# Patient Record
Sex: Female | Born: 1970
Health system: Southern US, Community
[De-identification: ages and names within clinical notes are randomized; demographics above are authoritative.]

## PROBLEM LIST (undated history)

## (undated) DIAGNOSIS — K589 Irritable bowel syndrome without diarrhea: Secondary | ICD-10-CM

## (undated) DIAGNOSIS — R87610 Atypical squamous cells of undetermined significance on cytologic smear of cervix (ASC-US): Secondary | ICD-10-CM

## (undated) DIAGNOSIS — K219 Gastro-esophageal reflux disease without esophagitis: Secondary | ICD-10-CM

## (undated) DIAGNOSIS — R87810 Cervical high risk human papillomavirus (HPV) DNA test positive: Secondary | ICD-10-CM

## (undated) DIAGNOSIS — J45909 Unspecified asthma, uncomplicated: Secondary | ICD-10-CM

## (undated) HISTORY — DX: Atypical squamous cells of undetermined significance on cytologic smear of cervix (ASC-US): R87.610

## (undated) HISTORY — DX: Irritable bowel syndrome, unspecified: K58.9

## (undated) HISTORY — PX: OTHER SURGICAL HISTORY: SHX169

## (undated) HISTORY — PX: THERAPEUTIC ABORTION: SHX798

## (undated) HISTORY — DX: Gastro-esophageal reflux disease without esophagitis: K21.9

## (undated) HISTORY — DX: Cervical high risk human papillomavirus (HPV) DNA test positive: R87.810

## (undated) HISTORY — DX: Unspecified asthma, uncomplicated: J45.909

---

## 1991-03-24 HISTORY — PX: APPENDECTOMY: SHX54

## 2008-03-23 HISTORY — PX: OTHER SURGICAL HISTORY: SHX169

## 2010-07-15 ENCOUNTER — Ambulatory Visit
Admission: RE | Admit: 2010-07-15 | Discharge: 2010-07-15 | Disposition: A | Discharge status: Home / Self Care | Payer: BLUE CROSS/BLUE SHIELD | Source: Ambulatory Visit | Attending: Obstetrics and Gynecology | Admitting: Obstetrics and Gynecology

## 2010-07-15 ENCOUNTER — Other Ambulatory Visit: Payer: Self-pay | Admitting: Obstetrics and Gynecology

## 2010-07-15 MED ORDER — IOHEXOL 300 MG/ML  SOLN
125.0000 mL | Freq: Once | INTRAMUSCULAR | Status: AC | PRN
  Administered 2010-07-15: 125 mL via INTRAVENOUS

## 2010-08-20 ENCOUNTER — Ambulatory Visit: Payer: Self-pay | Admitting: Gastroenterology

## 2010-10-02 LAB — HM MAMMOGRAPHY: HM Mammogram: NORMAL

## 2011-01-02 ENCOUNTER — Encounter: Payer: Self-pay | Admitting: *Deleted

## 2011-01-02 ENCOUNTER — Encounter: Payer: Self-pay | Admitting: Gynecology

## 2011-01-02 ENCOUNTER — Ambulatory Visit (INDEPENDENT_AMBULATORY_CARE_PROVIDER_SITE_OTHER): Payer: 59 | Admitting: Gynecology

## 2011-01-02 ENCOUNTER — Telehealth: Payer: Self-pay | Admitting: *Deleted

## 2011-01-02 VITALS — BP 130/70 | Ht 65.25 in | Wt 187.0 lb

## 2011-01-02 DIAGNOSIS — L293 Anogenital pruritus, unspecified: Secondary | ICD-10-CM

## 2011-01-02 DIAGNOSIS — N938 Other specified abnormal uterine and vaginal bleeding: Secondary | ICD-10-CM

## 2011-01-02 DIAGNOSIS — B3731 Acute candidiasis of vulva and vagina: Secondary | ICD-10-CM

## 2011-01-02 DIAGNOSIS — K589 Irritable bowel syndrome without diarrhea: Secondary | ICD-10-CM | POA: Insufficient documentation

## 2011-01-02 DIAGNOSIS — N949 Unspecified condition associated with female genital organs and menstrual cycle: Secondary | ICD-10-CM

## 2011-01-02 DIAGNOSIS — M069 Rheumatoid arthritis, unspecified: Secondary | ICD-10-CM | POA: Insufficient documentation

## 2011-01-02 DIAGNOSIS — R82998 Other abnormal findings in urine: Secondary | ICD-10-CM

## 2011-01-02 DIAGNOSIS — R319 Hematuria, unspecified: Secondary | ICD-10-CM

## 2011-01-02 DIAGNOSIS — B009 Herpesviral infection, unspecified: Secondary | ICD-10-CM | POA: Insufficient documentation

## 2011-01-02 DIAGNOSIS — L292 Pruritus vulvae: Secondary | ICD-10-CM

## 2011-01-02 DIAGNOSIS — B373 Candidiasis of vulva and vagina: Secondary | ICD-10-CM

## 2011-01-02 DIAGNOSIS — R823 Hemoglobinuria: Secondary | ICD-10-CM

## 2011-01-02 DIAGNOSIS — N390 Urinary tract infection, site not specified: Secondary | ICD-10-CM

## 2011-01-02 DIAGNOSIS — Z23 Encounter for immunization: Secondary | ICD-10-CM

## 2011-01-02 DIAGNOSIS — N920 Excessive and frequent menstruation with regular cycle: Secondary | ICD-10-CM

## 2011-01-02 MED ORDER — CIPROFLOXACIN HCL 250 MG PO TABS: 250.0000 mg | ORAL_TABLET | Freq: Two times a day (BID) | ORAL | Status: AC

## 2011-01-02 MED ORDER — FLUCONAZOLE 150 MG PO TABS: 150.0000 mg | ORAL_TABLET | Freq: Once | ORAL | Status: AC

## 2011-01-02 NOTE — Telephone Encounter (Signed)
The below note is a error, documentation on wrong pt. Please disregard.

## 2011-01-02 NOTE — Telephone Encounter (Signed)
Pt called wanting prescription for depo-provera shot. Pt last office visit states she is on birth control pills not depo. I told pt she would need to get medication from dr. Randa Evens office, being that she is the doctor that wrote Rx.

## 2011-01-02 NOTE — Progress Notes (Signed)
  Rx for Cipro called in to [facility] [facility].

## 2011-01-02 NOTE — Progress Notes (Signed)
40 year old G2 P0 TAB 2 female presents as a new patient complaining of years history of menorrhagia and now with breakthrough bleeding spotting on a daily basis for months. She was recently treated for UTI notes though that she's having some mild dysuria and vulvar itching also over the past several weeks. Her last annual exam was January 2012 and she had her mammogram this past year also. She is painful for rheumatoid arthritis but currently on no active management. Notes that her menses have been heavy and painful for years initially treated with narcotics but now with ibuprofen.  Exam directed towards complaint Spine: straight no CVA tenderness Abdomen: soft nontender without masses guarding rebound organomegaly Pelvic: External BUS vagina with brown old blood staining KOH wet prep done, cervix normal, uterus anteverted normal size midline mobile nontender, adnexa without masses or tenderness, rectovaginal exam is normal  Assessment and plan: 1. Wet prep positive for yeast. We'll treat with Diflucan 150x1 dose follow up if vulvar pruritus continues. 2. Menorrhagia, dub. Discussed differential to include hormonal dysfunction versus structural such as polyps leiomyoma versus endometriosis to include adenomyosis. We'll start with TSH FSH prolactin CBC sonohysterogram. Various scenarios and options to include attempted conservative such as hormonal manipulation low-dose pills IUD versus surgical options including hysterectomy was discussed. Patient will follow up for lab results and so his grandmother will go from there. 3. Urinary symptoms. Patient has mild dysuria recent treatment for UTI historically. Her urine today is contaminated but does show leukocyte esterase and blood with 2+ bacteria. Will cover with ciprofloxacin 250 twice a day x7 days follow up on culture. Call if symptoms persist.

## 2011-02-20 ENCOUNTER — Telehealth: Payer: Self-pay | Admitting: *Deleted

## 2011-02-20 NOTE — Telephone Encounter (Signed)
Pt called on 02/18/11 wanting refill for her xanax. Lm on pt vm to have pharmacy fax over refill request.

## 2011-03-24 DIAGNOSIS — R87610 Atypical squamous cells of undetermined significance on cytologic smear of cervix (ASC-US): Secondary | ICD-10-CM

## 2011-03-24 HISTORY — DX: Atypical squamous cells of undetermined significance on cytologic smear of cervix (ASC-US): R87.610

## 2011-05-25 IMAGING — CT CT ABD-PEL WO/W CM
2 of 6 series · 17 of 46 positions shown, 19 images · IV contrast (agent unspecified)
Comparison: None.

CLINICAL DATA: Lower abdominal pain.  Right-sided low back pain.
Nausea.  Diarrhea for three - 4 days.  Prior appendectomy in 3777.

CT ABDOMEN AND PELVIS WITHOUT AND WITH CONTRAST
TECHNIQUE: Multidetector CT imaging of the abdomen and pelvis was
performed without contrast material in one or both body regions,
followed by contrast material(s) and further sections in one or
both body regions.
Contrast: 125 ml Qmnipaque-1HH

[Series 4: abdomen w/ · axial · 0.70mm/px · z∈[-415,-45]mm · 14 of 86 slices shown, 16 images]
[im 6/86  soft-tissue]
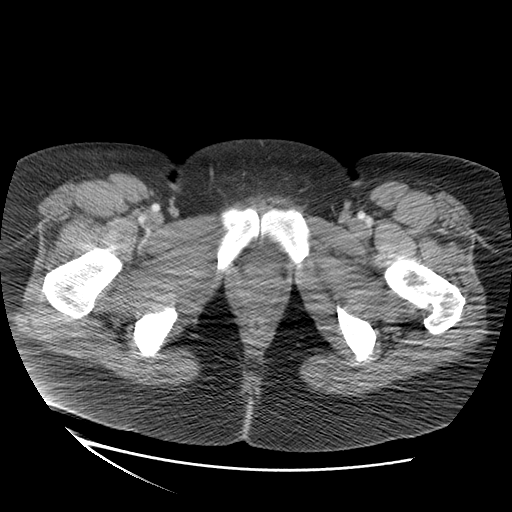
[im 6/86  bone]
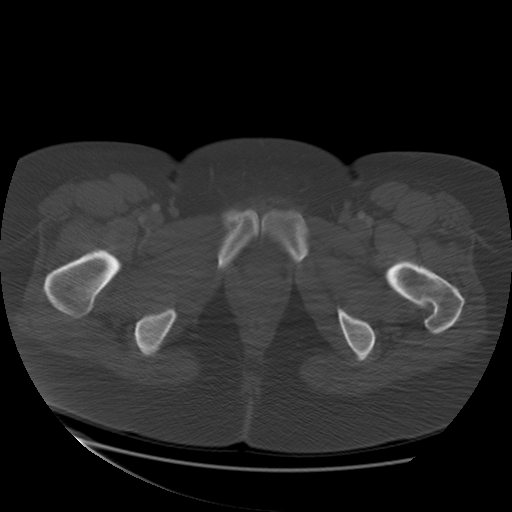
[im 12/86  soft-tissue]
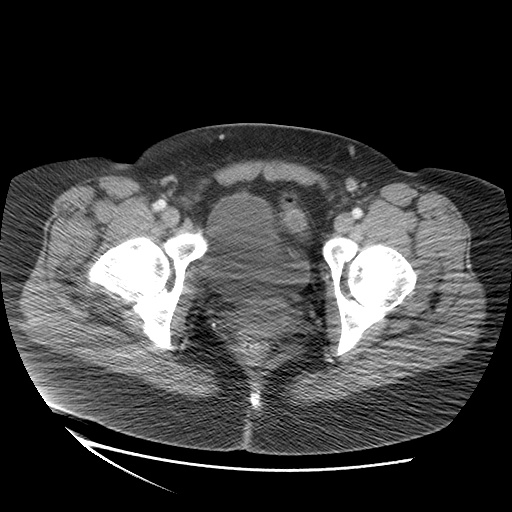
[im 18/86  soft-tissue]
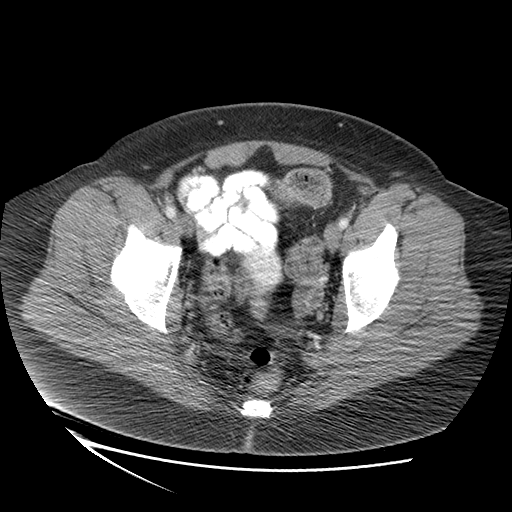
[im 23/86  soft-tissue]
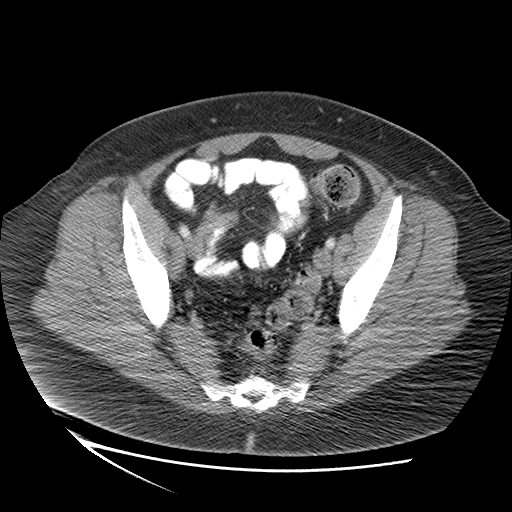
[im 29/86  soft-tissue]
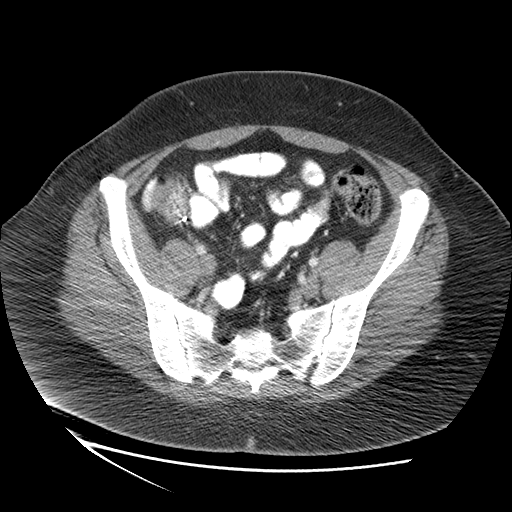
[im 35/86  soft-tissue]
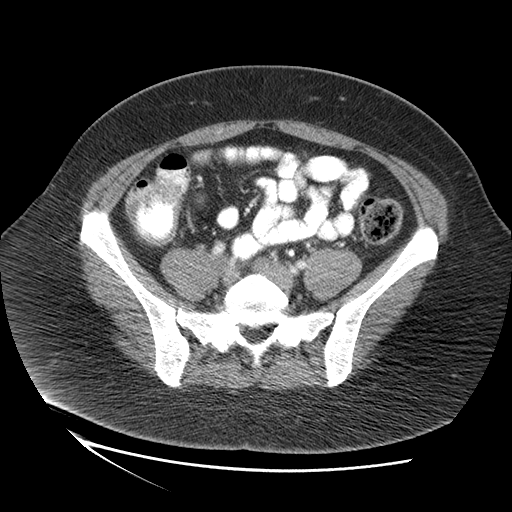
[im 40/86  soft-tissue]
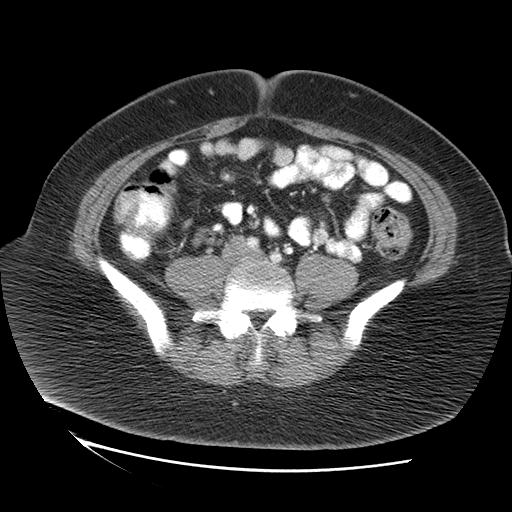
[im 46/86  soft-tissue]
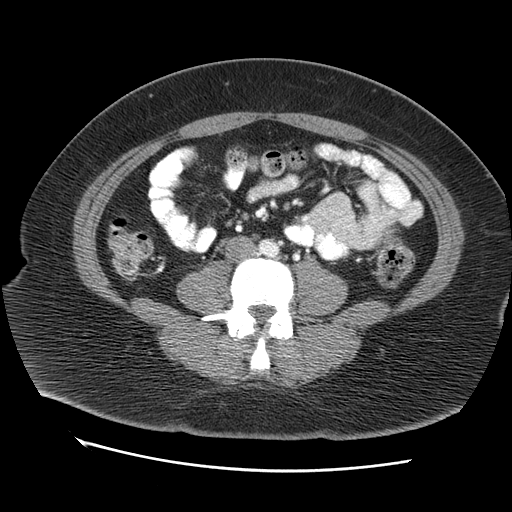
[im 52/86  soft-tissue]
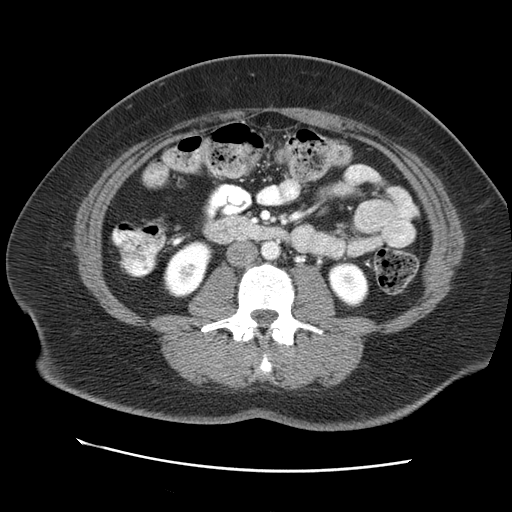
[im 52/86  bone]
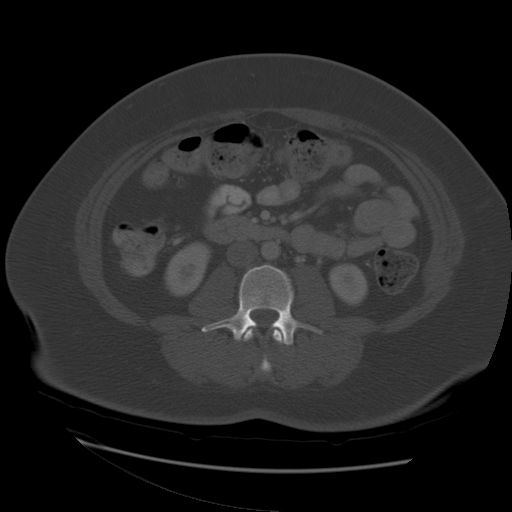
[im 57/86  soft-tissue]
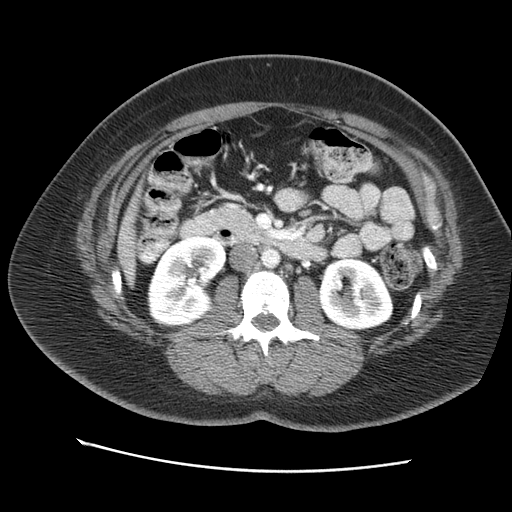
[im 63/86  soft-tissue]
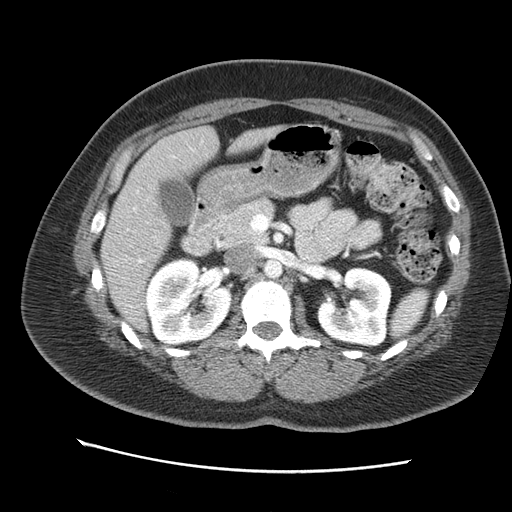
[im 69/86  soft-tissue]
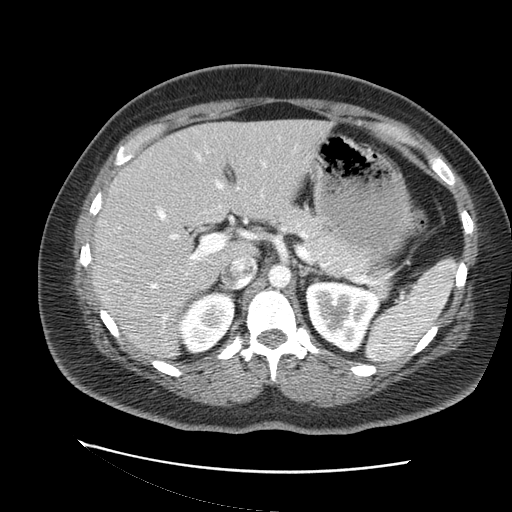
[im 74/86  soft-tissue]
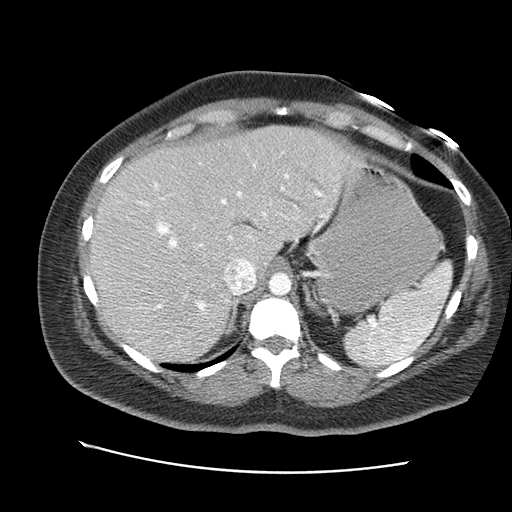
[im 80/86  soft-tissue]
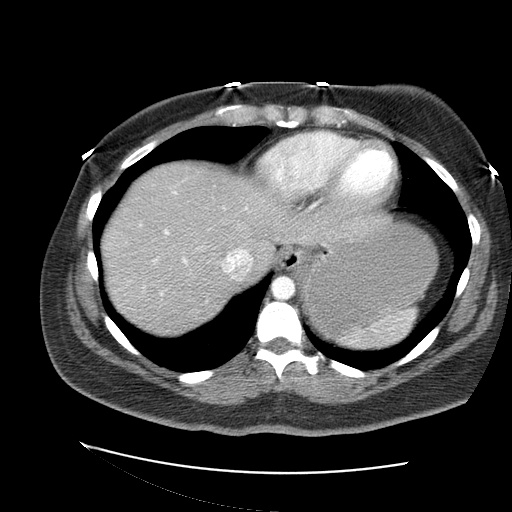

[Series 600: coronals · coronal · 0.88mm/px · 3 of 122 slices shown]
[im 41/122  soft-tissue]
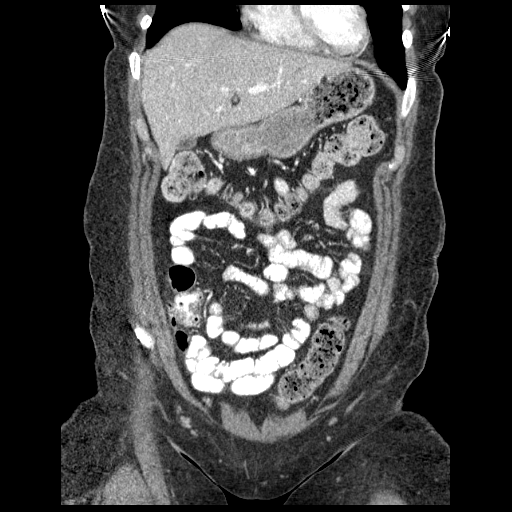
[im 54/122  soft-tissue]
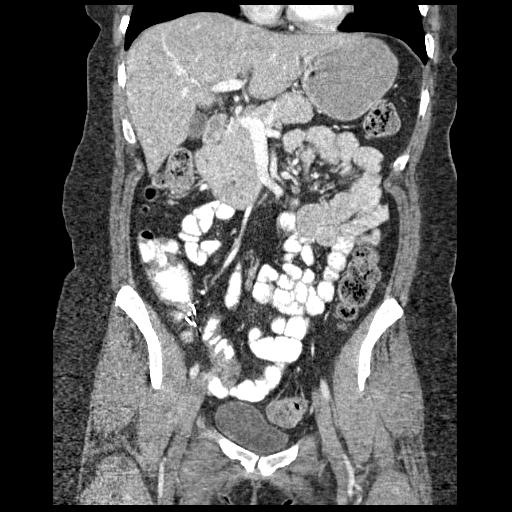
[im 68/122  soft-tissue]
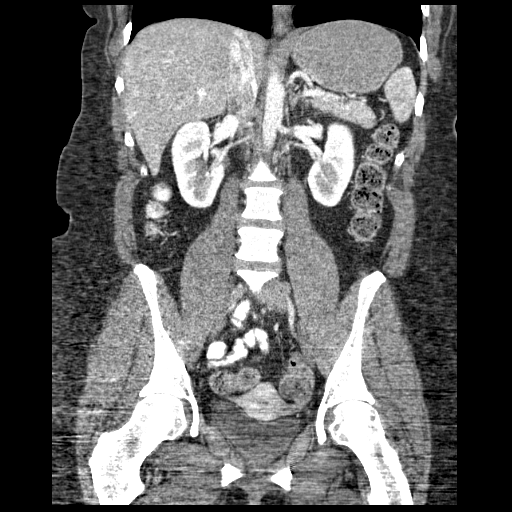

[17 of 46 positions shown; findings below may reference images not displayed]

FINDINGS: Clear lung bases.  Unenhanced images of the abdomen
demonstrate no renal calculi or hydroureter.

Postcontrast images demonstrate normal heart size without
pericardial or pleural effusion.  Mild hepatic steatosis.  No focal
liver lesion.  Normal spleen, stomach, pancreas, gallbladder,
biliary tract, adrenal glands, kidneys. No retroperitoneal or
retrocrural adenopathy.

Colonic stool burden suggests constipation.

Normal terminal ileum.  Surgical changes of prior appendectomy.
Normal small bowel loops.  Ileocolic mesenteric lymph nodes are
prominent.  The largest measures 1.6 x 1.1 cm on image 46.  No
cause is identified.

No pelvic adenopathy.  Normal urinary bladder and uterus.  No
adnexal mass or significant free pelvic fluid. No acute osseous
abnormality.
IMPRESSION: 1. No acute process in the abdomen or pelvis.
2.  Probable constipation.
3.  Mild hepatic steatosis.
4.  Prominent ileocolic mesenteric lymph nodes.  Most likely
reactive.  If follow-up is desired to confirm stability or
resolution, CT at 3 - 6 months could be performed.

## 2011-09-16 ENCOUNTER — Ambulatory Visit (INDEPENDENT_AMBULATORY_CARE_PROVIDER_SITE_OTHER): Payer: Self-pay

## 2011-09-16 DIAGNOSIS — R197 Diarrhea, unspecified: Secondary | ICD-10-CM

## 2011-09-29 ENCOUNTER — Encounter: Payer: Self-pay | Admitting: Gastroenterology

## 2011-09-29 ENCOUNTER — Ambulatory Visit (AMBULATORY_SURGERY_CENTER): Payer: 59 | Admitting: Gastroenterology

## 2011-09-29 VITALS — BP 123/77 | HR 70 | Temp 99.0°F | Resp 23 | Ht 66.0 in | Wt 190.0 lb

## 2011-09-29 DIAGNOSIS — D126 Benign neoplasm of colon, unspecified: Secondary | ICD-10-CM

## 2011-09-29 DIAGNOSIS — K589 Irritable bowel syndrome without diarrhea: Secondary | ICD-10-CM

## 2011-09-29 MED ORDER — SODIUM CHLORIDE 0.9 % IV SOLN: 500.0000 mL | INTRAVENOUS | Status: DC

## 2011-09-29 NOTE — Patient Instructions (Addendum)
YOU HAD AN ENDOSCOPIC PROCEDURE TODAY AT THE [facility]: Refer to the procedure report that was given to you for any specific questions about what was found during the examination.  If the procedure report does not answer your questions, please call your gastroenterologist to clarify.  If you requested that your care partner not be given the details of your procedure findings, then the procedure report has been included in a sealed envelope for you to review at your convenience later.  YOU SHOULD EXPECT: Some feelings of bloating in the abdomen. Passage of more gas than usual.  Walking can help get rid of the air that was put into your GI tract during the procedure and reduce the bloating. If you had a lower endoscopy (such as a colonoscopy or flexible sigmoidoscopy) you may notice spotting of blood in your stool or on the toilet paper. If you underwent a bowel prep for your procedure, then you may not have a normal bowel movement for a few days.  DIET: Your first meal following the procedure should be a light meal and then it is ok to progress to your normal diet.  A half-sandwich or bowl of soup is an example of a good first meal.  Heavy or fried foods are harder to digest and may make you feel nauseous or bloated.  Likewise meals heavy in dairy and vegetables can cause extra gas to form and this can also increase the bloating.  Drink plenty of fluids but you should avoid alcoholic beverages for 24 hours.  ACTIVITY: Your care partner should take you home directly after the procedure.  You should plan to take it easy, moving slowly for the rest of the day.  You can resume normal activity the day after the procedure however you should NOT DRIVE or use heavy machinery for 24 hours (because of the sedation medicines used during the test).    SYMPTOMS TO REPORT IMMEDIATELY: A gastroenterologist can be reached at any hour.  During normal business hours, 8:30 AM to 5:00 PM Monday through Friday,  call [phone].  After hours and on weekends, please call the GI answering service at [phone] who will take a message and have the physician on call contact you.   Following lower endoscopy (colonoscopy or flexible sigmoidoscopy):  Excessive amounts of blood in the stool  Significant tenderness or worsening of abdominal pains  Swelling of the abdomen that is new, acute  Fever of 100F or higher  FOLLOW UP: If any biopsies were taken you will be contacted by phone or by letter within the next 1-3 weeks.  Call your gastroenterologist if you have not heard about the biopsies in 3 weeks.  Our staff will call the home number listed on your records the next business day following your procedure to check on you and address any questions or concerns that you may have at that time regarding the information given to you following your procedure. This is a courtesy call and so if there is no answer at the home number and we have not heard from you through the emergency physician on call, we will assume that you have returned to your regular daily activities without incident.  SIGNATURES/CONFIDENTIALITY: You and/or your care partner have signed paperwork which will be entered into your electronic medical record.  These signatures attest to the fact that that the information above on your After Visit Summary has been reviewed and is understood.  Full responsibility of the confidentiality of this   discharge information lies with you and/or your care-partner.    Resume medications. Information given on polyps with discharge instructions. 

## 2011-09-29 NOTE — Progress Notes (Signed)
Patient did not experience any of the following events: a burn prior to discharge; a fall within the facility; wrong site/side/patient/procedure/implant event; or a hospital transfer or hospital admission upon discharge from the facility. (G8907) Patient did not have preoperative order for IV antibiotic SSI prophylaxis. (G8918)  

## 2011-09-29 NOTE — Progress Notes (Signed)
Abdominal pressure by the tech to aid the scope advancement. Maw   

## 2011-09-29 NOTE — Progress Notes (Signed)
14:30 hung second bag of normal saline 0.9% . Maw

## 2011-09-29 NOTE — Progress Notes (Signed)
The pt tolerated the colonoscopy very well. Maw   

## 2011-09-29 NOTE — Op Note (Signed)
[facility] [address] [place], [place]  [address]  COLONOSCOPY PROCEDURE REPORT  PATIENT:  Catherine Rodriguez, Catherine Rodriguez  MR#:  [id number] BIRTHDATE:  1970-12-19, 41 yrs. old  GENDER:  female ENDOSCOPIST:  Barbette Hair. Arlyce Dice, MD REF. BY: PROCEDURE DATE:  09/29/2011 PROCEDURE:  Colon with cold biopsy polypectomy ASA CLASS:  Class I INDICATIONS:  Research Protocol MEDICATIONS:   These medications were titrated to patient response per physician's verbal order, Fentanyl 75 mcg IV, Versed 8 mg IV  DESCRIPTION OF PROCEDURE:   After the risks benefits and alternatives of the procedure were thoroughly explained, informed consent was obtained.  Digital rectal exam was performed and revealed no abnormalities.   The LB [id number] endoscope was introduced through the anus and advanced to the cecum, which was identified by both the appendix and ileocecal valve, without limitations.  The quality of the prep was excellent, using MiraLax.  The instrument was then slowly withdrawn as the colon was fully examined. <<PROCEDUREIMAGES>>  FINDINGS:  A sessile polyp was found in the descending colon. It was 2 mm in size. The polyp was removed using cold biopsy forceps (see image3).  This was otherwise a normal examination of the colon (see image2 and image4).   Retroflexed views in the rectum revealed no abnormalities.    The time to cecum =  1) 4.25 minutes. The scope was then withdrawn in  1) 6.25  minutes from the cecum and the procedure completed. COMPLICATIONS:  None ENDOSCOPIC IMPRESSION: 1) 2 mm sessile polyp in the descending colon 2) Otherwise normal examination RECOMMENDATIONS: 1) If the polyp(s) removed today are proven to be adenomatous (pre-cancerous) polyps, you will need a repeat colonoscopy in 5 years. Otherwise you should continue to follow colorectal cancer screening guidelines for "routine risk" patients with colonoscopy in 10 years. You will receive a letter within 1-2  weeks with the results of your biopsy as well as final recommendations. Please call my office if you have not received a letter after 3 weeks. 2) per study protocol REPEAT EXAM:  You will receive a letter from Dr. Arlyce Dice in 1-2 weeks, after reviewing the final pathology, with followup recommendations.  ______________________________ Barbette Hair Arlyce Dice, MD  CC:  n. eSIGNED:   Barbette Hair. Versia Mignogna at 09/29/2011 02:49 PM  Steele Berg, [id number]

## 2011-09-30 ENCOUNTER — Telehealth: Payer: Self-pay

## 2011-09-30 NOTE — Telephone Encounter (Signed)
  Follow up Call-  Call back number 09/29/2011  Post procedure Call Back phone  # [phone]  Permission to leave phone message Yes     Patient questions:  Do you have a fever, pain , or abdominal swelling? no Pain Score  0 *  Have you tolerated food without any problems? yes  Have you been able to return to your normal activities? yes  Do you have any questions about your discharge instructions: Diet   no Medications  no Follow up visit  no  Do you have questions or concerns about your Care? no  Actions: * If pain score is 4 or above: No action needed, pain <4. I spoke with the pt's step father, Doreene Eland.  He said the pt was sleeping and that she did fine last night.  I asked her to call us if she had any questions or concerns. Maw

## 2011-10-05 ENCOUNTER — Ambulatory Visit: Payer: Self-pay

## 2011-10-05 ENCOUNTER — Encounter: Payer: Self-pay | Admitting: Gastroenterology

## 2011-10-20 ENCOUNTER — Ambulatory Visit: Payer: Self-pay

## 2011-10-21 ENCOUNTER — Ambulatory Visit: Payer: Self-pay

## 2011-10-22 ENCOUNTER — Ambulatory Visit (INDEPENDENT_AMBULATORY_CARE_PROVIDER_SITE_OTHER): Payer: Self-pay

## 2011-10-22 DIAGNOSIS — R197 Diarrhea, unspecified: Secondary | ICD-10-CM

## 2011-11-04 ENCOUNTER — Ambulatory Visit (INDEPENDENT_AMBULATORY_CARE_PROVIDER_SITE_OTHER): Payer: Self-pay

## 2011-11-04 DIAGNOSIS — R197 Diarrhea, unspecified: Secondary | ICD-10-CM

## 2011-11-05 ENCOUNTER — Ambulatory Visit: Payer: Self-pay

## 2011-12-21 ENCOUNTER — Ambulatory Visit (INDEPENDENT_AMBULATORY_CARE_PROVIDER_SITE_OTHER): Payer: 59 | Admitting: Gynecology

## 2011-12-21 ENCOUNTER — Encounter: Payer: Self-pay | Admitting: Gynecology

## 2011-12-21 ENCOUNTER — Other Ambulatory Visit (HOSPITAL_COMMUNITY)
Admission: RE | Admit: 2011-12-21 | Discharge: 2011-12-21 | Disposition: A | Discharge status: Home / Self Care | Payer: 59 | Source: Ambulatory Visit | Attending: Gynecology | Admitting: Gynecology

## 2011-12-21 VITALS — BP 132/86 | Ht 65.0 in | Wt 195.0 lb

## 2011-12-21 DIAGNOSIS — Z1151 Encounter for screening for human papillomavirus (HPV): Secondary | ICD-10-CM | POA: Insufficient documentation

## 2011-12-21 DIAGNOSIS — Z01419 Encounter for gynecological examination (general) (routine) without abnormal findings: Secondary | ICD-10-CM | POA: Insufficient documentation

## 2011-12-21 DIAGNOSIS — G4700 Insomnia, unspecified: Secondary | ICD-10-CM

## 2011-12-21 DIAGNOSIS — Z131 Encounter for screening for diabetes mellitus: Secondary | ICD-10-CM

## 2011-12-21 DIAGNOSIS — Z13220 Encounter for screening for lipoid disorders: Secondary | ICD-10-CM

## 2011-12-21 DIAGNOSIS — B3731 Acute candidiasis of vulva and vagina: Secondary | ICD-10-CM

## 2011-12-21 DIAGNOSIS — B373 Candidiasis of vulva and vagina: Secondary | ICD-10-CM

## 2011-12-21 DIAGNOSIS — N920 Excessive and frequent menstruation with regular cycle: Secondary | ICD-10-CM

## 2011-12-21 DIAGNOSIS — A6000 Herpesviral infection of urogenital system, unspecified: Secondary | ICD-10-CM

## 2011-12-21 DIAGNOSIS — N898 Other specified noninflammatory disorders of vagina: Secondary | ICD-10-CM

## 2011-12-21 DIAGNOSIS — R87810 Cervical high risk human papillomavirus (HPV) DNA test positive: Secondary | ICD-10-CM | POA: Insufficient documentation

## 2011-12-21 DIAGNOSIS — R21 Rash and other nonspecific skin eruption: Secondary | ICD-10-CM

## 2011-12-21 LAB — WET PREP FOR TRICH, YEAST, CLUE: Trich, Wet Prep: NONE SEEN

## 2011-12-21 MED ORDER — FLUCONAZOLE 200 MG PO TABS: 200.0000 mg | ORAL_TABLET | Freq: Every day | ORAL | Status: DC

## 2011-12-21 MED ORDER — PROMETHAZINE HCL 25 MG PO TABS: 25.0000 mg | ORAL_TABLET | Freq: Four times a day (QID) | ORAL | Status: DC | PRN

## 2011-12-21 MED ORDER — ACYCLOVIR 400 MG PO TABS: 400.0000 mg | ORAL_TABLET | Freq: Two times a day (BID) | ORAL | Status: DC

## 2011-12-21 MED ORDER — NYSTATIN-TRIAMCINOLONE 100000-0.1 UNIT/GM-% EX OINT: TOPICAL_OINTMENT | Freq: Two times a day (BID) | CUTANEOUS | Status: DC

## 2011-12-21 MED ORDER — ALPRAZOLAM 0.5 MG PO TABS: 0.5000 mg | ORAL_TABLET | Freq: Every evening | ORAL | Status: DC | PRN

## 2011-12-21 NOTE — Progress Notes (Signed)
SHADE RIVENBARK Dec 10, 1970 [id number]        41 y.o.  G2P0020 for annual exam.  Several issues noted below.  Past medical history,surgical history, medications, allergies, family history and social history were all reviewed and documented in the EPIC chart. ROS:  Was performed and pertinent positives and negatives are included in the history.  Exam: Fleet Contras assistant Filed Vitals:   12/21/11 1155  BP: 132/86  Height: 5\' 5"  (1.651 m)  Weight: 195 lb (88.451 kg)   General appearance  Normal Skin grossly normal Head/Neck normal with no cervical or supraclavicular adenopathy thyroid normal Lungs  clear Cardiac RR, without RMG Abdominal  soft, nontender, without masses, organomegaly or hernia Breasts  examined lying and sitting without masses, retractions, discharge or axillary adenopathy.  Superficial fungal type rash between breasts over sternum. Pelvic  Ext/BUS/vagina  normal with white discharge  Cervix  normal Pap/HPV  Uterus  anteverted, normal size, shape and contour, midline and mobile nontender   Adnexa  Without masses or tenderness    Anus and perineum  normal   Rectovaginal  normal sphincter tone without palpated masses or tenderness.    Assessment/Plan:  41 y.o. G55P0020 female for annual exam.   1. Menorrhagia. Patient's periods continue heavy with cramping. She had normal lab work last year of TSH FSH prolactin. She was to do a sonohysterogram but never followed up for this. Her menses continue heavy with a lot of cramping lasting 7 days with frequent changes. Exam is normal.   Recommended sonohysterogram now rule out submucous myoma/polyp. Options for management to include hormonal manipulation such as pill patch ring Mirena IUD reviewed. She also needs contraception and Implanon/Depo-Provera also discussed. Patient had a lot of weight gain with Depo-Provera before. Patient really leaning towards hysterectomy. Does not desire childbearing. Follow up for sonohysterogram and  then we'll further discuss options.  CBC ordered. 2. Mammography. Patient due now and knows to schedule this. SBE monthly reviewed. 3. Skin rash between breasts/vaginal discharge positive for yeast on wet prep. Also with periclitoral itching. Treat with Diflucan 200 daily x5 days and Mytrex cream twice a day as needed. Follow up if symptoms persist or recur. 4. Pap smear. Pap/HPV done. No history of significant abnormalities before.  If negative plan every 5 year screening. 5. History of herpes. Occasional outbreaks. Uses acyclovir 400 twice a day times several days to abort. #32 refills given. 6. Insomnia. Uses Xanax 0.5 mg occasionally for insomnia and premenstrual anxiety. #32 refills given. 7. Nausea perimenstrually. Phenergan 25 mg tablet #30 1 refill given. 8. Health maintenance. Baseline glucose lipid profile urinalysis ordered with CBC. Follow up for lab results and ultrasound.    Dara Lords MD, 12:59 PM 12/21/2011

## 2011-12-21 NOTE — Patient Instructions (Signed)
Take Diflucan daily for 5 days and use the Mytrex cream externally twice daily for rash. Follow up for lab work and ultrasound as scheduled.

## 2011-12-22 ENCOUNTER — Telehealth: Payer: Self-pay | Admitting: Gynecology

## 2011-12-22 ENCOUNTER — Other Ambulatory Visit: Payer: 59

## 2011-12-22 DIAGNOSIS — Z01419 Encounter for gynecological examination (general) (routine) without abnormal findings: Secondary | ICD-10-CM

## 2011-12-22 DIAGNOSIS — R87810 Cervical high risk human papillomavirus (HPV) DNA test positive: Secondary | ICD-10-CM

## 2011-12-22 DIAGNOSIS — Z13220 Encounter for screening for lipoid disorders: Secondary | ICD-10-CM

## 2011-12-22 DIAGNOSIS — Z131 Encounter for screening for diabetes mellitus: Secondary | ICD-10-CM

## 2011-12-22 LAB — CBC WITH DIFFERENTIAL/PLATELET
Eosinophils Relative: 2 % (ref 0–5)
Lymphocytes Relative: 35 % (ref 12–46)
Lymphs Abs: 1.5 10*3/uL (ref 0.7–4.0)
MCV: 93.1 fL (ref 78.0–100.0)
Platelets: 262 10*3/uL (ref 150–400)
RBC: 4.47 MIL/uL (ref 3.87–5.11)
WBC: 4.4 10*3/uL (ref 4.0–10.5)

## 2011-12-22 LAB — URINALYSIS W MICROSCOPIC + REFLEX CULTURE
Ketones, ur: NEGATIVE mg/dL
Leukocytes, UA: NEGATIVE
Nitrite: NEGATIVE
Urobilinogen, UA: 0.2 mg/dL (ref 0.0–1.0)

## 2011-12-22 LAB — LIPID PANEL
Cholesterol: 142 mg/dL (ref 0–200)
HDL: 51 mg/dL (ref >39)
Total CHOL/HDL Ratio: 2.8 Ratio

## 2011-12-22 LAB — GLUCOSE, RANDOM: Glucose, Bld: 79 mg/dL (ref 70–99)

## 2011-12-22 NOTE — Telephone Encounter (Signed)
Tell patient HPV positive on pap.  Recommend patient schedule colposcopy appointment and we can discuss at visit.  Order placed

## 2011-12-23 NOTE — Telephone Encounter (Signed)
Pt informed with the below note, pt will call back to make appointment. 

## 2011-12-28 ENCOUNTER — Ambulatory Visit (INDEPENDENT_AMBULATORY_CARE_PROVIDER_SITE_OTHER): Payer: 59 | Admitting: Gynecology

## 2011-12-28 ENCOUNTER — Encounter: Payer: Self-pay | Admitting: Gynecology

## 2011-12-28 DIAGNOSIS — N920 Excessive and frequent menstruation with regular cycle: Secondary | ICD-10-CM

## 2011-12-28 DIAGNOSIS — R87810 Cervical high risk human papillomavirus (HPV) DNA test positive: Secondary | ICD-10-CM

## 2011-12-28 NOTE — Patient Instructions (Signed)
Follow up for ultrasound and colposcopy as scheduled.

## 2011-12-28 NOTE — Progress Notes (Signed)
Patient presents with several year history of worsening menorrhagia dysmenorrhea. Requires double protection with bleed 2 episodes and is life altering. Recently had exam with normal pelvic. Pap smear showed normal cytology with positive high-risk HPV. She is scheduled for a colposcopy end of this week and sonohysterogram. She had questions about whether these were necessary and she had decided that she wanted hysterectomy regardless. I again reviewed her situation and options. I discussed the 2 separate issues as follows: 1. 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 Has not been sexually active recently although boyfriend is returning end of this month. We discussed the issues of exposure and that she may have carried this from years ago. She understands that this is a global infection effects the vagina and vulva and that by hysterectomy and does not remove the virus and that she is at risk of developing viral changes at the vagina and vulva regardless of whether she has a hysterectomy or not. Options of repeating Pap smear with HPV in a year/subtype 1618 now/colposcopy regardless discussed and she wants to proceed with colposcopy already scheduled at the end of this week. 2. Menorrhagia/dysmenorrhea. Questioning whether she wants to do ultrasound if she wants hysterectomy regardless. I again discussed options for management possibilities to include observation, hormonal manipulation such as low-dose oral contraceptives, progesterone only, Mirena IUD, endometrial ablation and hysterectomy. If sonohysterogram shows submucous myoma or polyp hysteroscopy with removal may improve her situation with a relatively minor procedure. If normal then options again would be as above to include hysterectomy. If she wanted to proceed with hysterectomy regardless even if she knew she had a submucous myoma or polyp I discussed the possibility. She did have a CT scan of the pelvis April 2012 during which she was having the  same complaints and it was normal for fibroids/ovarian disease. She does need contraception also and an issue of ablation needing assured contraception following reviewed. She also understands that she still is at risk of failure if she chooses IUDs/ablation/hormonal manipulation. Given her history of several years worth of symptoms, normal exam and CT scan showing a normal pelvis, I suspect that she has adenomyosis. We also reviewed the absolute irreversible sterility associated with hysterectomy given that she has no children and she emphatically states that this is not an issue and that she and her boyfriend have closed this before and that they do not desire children. After a lengthy discussion patient has decided to proceed with sonohysterogram first and will keep her scheduled appointment and then we'll rediscuss her options and decision at that time.  Lastly we did review the ovarian conservation issue. Her sister did develop breast cancer in her mid 46s and she was concerned about that issue. Apparently her sister was checked genetically and did not carry a genetic marker. The issues of keeping her ovaries and keeping continued estrogen production as well as the risk of ovarian cancer in the future reviewed. The removal of her ovaries and the risks of  hypoestrogenic symptoms ultimately necessitating estrogen replacement therapy as well as the risks of accelerated cardiovascular risk of osteoporosis was also discussed with her.

## 2011-12-30 ENCOUNTER — Ambulatory Visit (INDEPENDENT_AMBULATORY_CARE_PROVIDER_SITE_OTHER): Payer: Self-pay

## 2011-12-30 DIAGNOSIS — R197 Diarrhea, unspecified: Secondary | ICD-10-CM

## 2011-12-31 ENCOUNTER — Encounter: Payer: Self-pay | Admitting: Gynecology

## 2011-12-31 ENCOUNTER — Ambulatory Visit (INDEPENDENT_AMBULATORY_CARE_PROVIDER_SITE_OTHER): Payer: 59 | Admitting: Gynecology

## 2011-12-31 DIAGNOSIS — Z309 Encounter for contraceptive management, unspecified: Secondary | ICD-10-CM

## 2011-12-31 DIAGNOSIS — N920 Excessive and frequent menstruation with regular cycle: Secondary | ICD-10-CM

## 2011-12-31 DIAGNOSIS — R87810 Cervical high risk human papillomavirus (HPV) DNA test positive: Secondary | ICD-10-CM

## 2011-12-31 NOTE — Patient Instructions (Signed)
Follow up for sonohysterogram tomorrow.

## 2011-12-31 NOTE — Progress Notes (Signed)
Patient ID: Catherine Rodriguez, female   DOB: 1970/09/20, 41 y.o.   MRN: [id number] Patient presents for colposcopy with first abnormal Pap smear showing positive high-risk HPV with normal cytology.  Exam with Sherrilyn Rist assistant External BUS vagina normal. Cervix normal. Colposcopy after acetic acid cleanse adequate and normal..  Physical Exam  Genitourinary:     Assessment and plan: Positive high-risk HPV, normal cytology. Colposcopy adequate normal. Recommend repeat cytology/HPV one year.  Patient has some histogram schedule tomorrow for her history of menorrhagia. We again discussed various contraceptive options to include pill patch ring Depo-Provera essure Mirena IUD hysterectomy. She is very fearful of hormone containing options as she gained a lot of weight with Depo-Provera before. I think she would be a good candidate for Mirena as it will address both contraception and bleeding. We will further discuss this tomorrow after her sonohysterogram

## 2012-01-01 ENCOUNTER — Encounter: Payer: Self-pay | Admitting: Gynecology

## 2012-01-01 ENCOUNTER — Other Ambulatory Visit: Payer: Self-pay | Admitting: Gynecology

## 2012-01-01 ENCOUNTER — Ambulatory Visit (INDEPENDENT_AMBULATORY_CARE_PROVIDER_SITE_OTHER): Payer: 59 | Admitting: Gynecology

## 2012-01-01 ENCOUNTER — Ambulatory Visit (INDEPENDENT_AMBULATORY_CARE_PROVIDER_SITE_OTHER): Payer: 59

## 2012-01-01 DIAGNOSIS — N920 Excessive and frequent menstruation with regular cycle: Secondary | ICD-10-CM

## 2012-01-01 DIAGNOSIS — Z309 Encounter for contraceptive management, unspecified: Secondary | ICD-10-CM

## 2012-01-01 DIAGNOSIS — N946 Dysmenorrhea, unspecified: Secondary | ICD-10-CM

## 2012-01-01 NOTE — Patient Instructions (Signed)
Follow up for Mirena IUD as we discussed.   Intrauterine Device Insertion Most often, an intrauterine device (IUD) is inserted into the uterus to prevent pregnancy. There are 2 types of IUDs available:  Copper IUD. This type of IUD creates an environment that is not favorable to sperm survival. The mechanism of action of the copper IUD is not known for certain. It can stay in place for 10 years.  Hormone IUD. This type of IUD contains the hormone progestin (synthetic progesterone). The progestin thickens the cervical mucus and prevents sperm from entering the uterus, and it also thins the uterine lining. There is no evidence that the hormone IUD prevents implantation. The hormone IUD can stay in place for up to 5 years. An IUD is the most cost-effective birth control if left in place for the full duration. It may be removed at any time. LET YOUR CAREGIVER KNOW ABOUT:  Sensitivity to metals.  Medicines taken including herbs, eyedrops, over-the-counter medicines, and creams.  Use of steroids (by mouth or creams).  Previous problems with anesthetics or numbing medicine.  Previous gynecological surgery.  History of blood clots or clotting disorders.  Possibility of pregnancy.  Menstrual irregularities.  Concerns regarding unusual vaginal discharge or odors.  Previous experience with an IUD.  Other health problems. RISKS AND COMPLICATIONS  Accidental puncture (perforation) of the uterus.  Accidental placement of the IUD either in the muscle layer of the uterus (myometrium) or outside the uterus. If this happen, the IUD can be found essentially floating around the bowels. When this happens, the IUD must be taken out surgically.  The IUD may fall out of the uterus (expulsion). This is more common in women who have recently had a child.   Pregnancy in the fallopian tube (ectopic). BEFORE THE PROCEDURE  Schedule the IUD insertion for when you will have your menstrual period or  right after, to make sure you are not pregnant. Placement of the IUD is better tolerated shortly after a menstrual cycle.  You may need to take tests or be examined to make sure you are not pregnant.  You may be required to take a pregnancy test.  You may be required to get checked for sexually transmitted infections (STIs) prior to placement. Placing an IUD in someone who has an infection can make an infection worse.  You may be given a pain reliever to take 1 or 2 hours before the procedure.  An exam will be performed to determine the size and position of your uterus.  Ask your caregiver about changing or stopping your regular medicines. PROCEDURE   A tool (speculum) is placed in the vagina. This allows your caregiver to see the lower part of the uterus (cervix).  The cervix is prepped with a medicine that lowers the risk of infection.  You may be given a medicine to numb each side of the cervix (intracervical or paracervical block). This is used to block and control any discomfort with insertion.  A tool (uterine sound) is inserted into the uterus to determine the length of the uterine cavity and the direction the uterus may be tilted.  A slim instrument (IUD inserter) is inserted through the cervical canal and into your uterus.  The IUD is placed in the uterine cavity and the insertion device is removed.  The nylon string that is attached to the IUD, and used for eventual IUD removal, is trimmed. It is trimmed so that it lays high in the vagina, just outside the cervix.  AFTER THE PROCEDURE  You may have bleeding after the procedure. This is normal. It varies from light spotting for a few days to menstrual-like bleeding.  You may have mild cramping.  Practice checking the string coming out of the cervix to make sure the IUD remains in the uterus. If you cannot feel the string, you should schedule a "string check" with your caregiver.  If you had a hormone IUD inserted, expect  that your period may be lighter or nonexistent within a year's time (though this is not always the case). There may be delayed fertility with the hormone IUD as a result of its progesterone effect. When you are ready to become pregnant, it is suggested to have the IUD removed up to 1 year in advance.  Yearly exams are advised. Document Released: 11/05/2010 Document Revised: 06/01/2011 Document Reviewed: 11/05/2010 [facility] Patient Information 2013 [place], [place].

## 2012-01-01 NOTE — Progress Notes (Signed)
Patient presents for sonohysterogram due to her history of menorrhagia/dysmenorrhea.  Ultrasound shows normal uterus with normal echotexture. Endometrial echo 6.7 mm. Right and left ovaries visualized and normal. Cul-de-sac negative. Sonohysterogram performed, sterile technique, easy catheter introduction, good distention with no abnormalities. Endometrial sample taken. Patient tolerated well.  Assessment and plan: Menorrhagia/dysmenorrhea.  Ultrasound normal. Patient will follow up her biopsy results. We again discussed options for management and now the patient is leaning towards Mirena IUD both from a contraceptive standpoint and menstrual suppressive standpoint. Patient will schedule this at her convenience.

## 2012-01-04 ENCOUNTER — Telehealth: Payer: Self-pay | Admitting: *Deleted

## 2012-01-04 NOTE — Telephone Encounter (Signed)
(  pt aware you out of the office) Pt called and decided she doesn't want the mirena IUD, she asked to start on low estrogen birth control pill.

## 2012-01-05 MED ORDER — NORETHINDRONE ACET-ETHINYL EST 1-20 MG-MCG PO TABS: 1.0000 | ORAL_TABLET | Freq: Every day | ORAL | Status: DC

## 2012-01-05 NOTE — Telephone Encounter (Signed)
Document that patient does not smoke and remind her the increased risk of blood clots, stroke, heart attack DVT, with bcps.  Back up contraception with condoms first pac.

## 2012-01-05 NOTE — Telephone Encounter (Signed)
Pt informed with the below note. 

## 2012-01-08 ENCOUNTER — Ambulatory Visit: Payer: 59 | Admitting: Gynecology

## 2012-01-26 ENCOUNTER — Telehealth: Payer: Self-pay | Admitting: *Deleted

## 2012-01-26 MED ORDER — TRAMADOL HCL 50 MG PO TABS: 50.0000 mg | ORAL_TABLET | Freq: Four times a day (QID) | ORAL | Status: DC | PRN

## 2012-01-26 NOTE — Telephone Encounter (Signed)
Pt called requesting another medication to take for her bad cramps. Pt cycle started Sunday and she missed work 1 day due to cramps. Pt said that Dr.McPhail gave her ibuprofen 800 mg but it doesn't help. She has tired tylenol and not relief from that as well. Pt said that she has a fatty liver due taking a lot of ibuprofen. Please advise

## 2012-01-26 NOTE — Telephone Encounter (Signed)
We can try Ultram 50 mg one by mouth every 6 hour when necessary pain #30. I think she was moving towards IUD is from a contraceptive and heavy periods standpoint this will also help with her cramping I would suggest that she actively pursue setting up that appointment.

## 2012-01-27 NOTE — Telephone Encounter (Signed)
Pt informed with the below note. 

## 2012-01-27 NOTE — Telephone Encounter (Signed)
Left message for pt to call.

## 2012-03-18 ENCOUNTER — Encounter: Payer: Self-pay | Admitting: Gynecology

## 2012-03-29 ENCOUNTER — Telehealth: Payer: Self-pay | Admitting: *Deleted

## 2012-03-29 NOTE — Telephone Encounter (Signed)
Pt informed, transferred to appointment desk.

## 2012-03-29 NOTE — Telephone Encounter (Signed)
(  TF patient) Pt calling c/o yeast infection, she would refill on nystatin-triamcinolone ointment and diflucan, given on 12/21/11. And it did help yeast, pt has itching, white discharge. Pt said this is on going problem and doesn't see why she would need to make OV. Please advise

## 2012-03-29 NOTE — Telephone Encounter (Signed)
Patient needs to come in. Dr. Velvet Bathe saw her for annual 4 months ago. We need to make sure there are no other superimposed microorganisms instead of  of just treating her  over the phone.

## 2012-03-30 ENCOUNTER — Ambulatory Visit: Payer: 59 | Admitting: Women's Health

## 2012-08-10 ENCOUNTER — Other Ambulatory Visit: Payer: Self-pay | Admitting: Gynecology

## 2013-01-05 ENCOUNTER — Encounter: Payer: Self-pay | Admitting: Gynecology

## 2013-01-05 ENCOUNTER — Ambulatory Visit (INDEPENDENT_AMBULATORY_CARE_PROVIDER_SITE_OTHER): Payer: 59 | Admitting: Gynecology

## 2013-01-05 ENCOUNTER — Other Ambulatory Visit (HOSPITAL_COMMUNITY)
Admission: RE | Admit: 2013-01-05 | Discharge: 2013-01-05 | Disposition: A | Discharge status: Home / Self Care | Payer: 59 | Source: Ambulatory Visit | Attending: Gynecology | Admitting: Gynecology

## 2013-01-05 VITALS — BP 130/84 | Ht 65.0 in | Wt 225.0 lb

## 2013-01-05 DIAGNOSIS — Z1151 Encounter for screening for human papillomavirus (HPV): Secondary | ICD-10-CM | POA: Insufficient documentation

## 2013-01-05 DIAGNOSIS — R6889 Other general symptoms and signs: Secondary | ICD-10-CM

## 2013-01-05 DIAGNOSIS — Z01419 Encounter for gynecological examination (general) (routine) without abnormal findings: Secondary | ICD-10-CM

## 2013-01-05 DIAGNOSIS — Z13220 Encounter for screening for lipoid disorders: Secondary | ICD-10-CM

## 2013-01-05 DIAGNOSIS — R21 Rash and other nonspecific skin eruption: Secondary | ICD-10-CM

## 2013-01-05 DIAGNOSIS — IMO0002 Reserved for concepts with insufficient information to code with codable children: Secondary | ICD-10-CM

## 2013-01-05 LAB — CBC WITH DIFFERENTIAL/PLATELET
Basophils Absolute: 0 10*3/uL (ref 0.0–0.1)
Basophils Relative: 1 % (ref 0–1)
HCT: 41.1 % (ref 36.0–46.0)
Lymphocytes Relative: 25 % (ref 12–46)
MCHC: 34.5 g/dL (ref 30.0–36.0)
Monocytes Absolute: 0.5 10*3/uL (ref 0.1–1.0)
Neutro Abs: 4.4 10*3/uL (ref 1.7–7.7)
Neutrophils Relative %: 65 % (ref 43–77)
Platelets: 315 10*3/uL (ref 150–400)
RDW: 13.3 % (ref 11.5–15.5)
WBC: 6.7 10*3/uL (ref 4.0–10.5)

## 2013-01-05 LAB — TSH: TSH: 1.381 u[IU]/mL (ref 0.350–4.500)

## 2013-01-05 LAB — COMPREHENSIVE METABOLIC PANEL
ALT: 14 U/L (ref 0–35)
AST: 20 U/L (ref 0–37)
Albumin: 4.4 g/dL (ref 3.5–5.2)
Alkaline Phosphatase: 71 U/L (ref 39–117)
BUN: 17 mg/dL (ref 6–23)
Calcium: 9.6 mg/dL (ref 8.4–10.5)
Chloride: 101 mEq/L (ref 96–112)
Potassium: 4.7 mEq/L (ref 3.5–5.3)
Sodium: 138 mEq/L (ref 135–145)

## 2013-01-05 LAB — LIPID PANEL
HDL: 58 mg/dL (ref >39)
LDL Cholesterol: 96 mg/dL (ref 0–99)

## 2013-01-05 MED ORDER — FLUCONAZOLE 150 MG PO TABS: 150.0000 mg | ORAL_TABLET | Freq: Once | ORAL | Status: DC

## 2013-01-05 MED ORDER — NYSTATIN-TRIAMCINOLONE 100000-0.1 UNIT/GM-% EX OINT: TOPICAL_OINTMENT | Freq: Two times a day (BID) | CUTANEOUS | Status: DC

## 2013-01-05 NOTE — Addendum Note (Signed)
Addended by: Dayna Barker on: 01/05/2013 03:14 PM   Modules accepted: Orders

## 2013-01-05 NOTE — Progress Notes (Signed)
Catherine Rodriguez 20-Jul-1970 [id number]        42 y.o.  G2P0020 for annual exam.  Several issues noted below.  Past medical history,surgical history, medications, allergies, family history and social history were all reviewed and documented in the EPIC chart.  ROS:  Performed and pertinent positives and negatives are included in the history, assessment and plan .  Exam: Kim assistant Filed Vitals:   01/05/13 1427  BP: 130/84  Height: 5\' 5"  (1.651 m)  Weight: 225 lb (102.059 kg)   General appearance  Normal Skin grossly normal Head/Neck normal with no cervical or supraclavicular adenopathy thyroid normal Lungs  clear Cardiac RR, without RMG Abdominal  soft, nontender, without masses, organomegaly or hernia Breasts  examined lying and sitting without masses, retractions, discharge or axillary adenopathy. Pelvic  Ext/BUS/vagina  normal. Rash at skin fold between perineum and right thigh. Consistent with fungal.  Cervix  normal Pap/HPV  Uterus  anteverted, normal size, shape and contour, midline and mobile nontender   Adnexa  Without masses or tenderness    Anus and perineum  normal   Rectovaginal  normal sphincter tone without palpated masses or tenderness.    Assessment/Plan:  42 y.o. G71P0020 female for annual exam.   1. Menorrhagia. Menses continue to be heavy. Sonohysterogram/ biopsy negative last year. Patient started on low-dose oral contraceptives but again a fair amount of weight and stopped them. Not sexually active. I again reviewed options to include Mirena IUD endometrial ablation and hysterectomy. Strongly recommended she consider Mirena IUD as it would address both the contraceptive and bleeding issues. Patient will followup with her decision but is not ready to commit at this time. 2. Borderline hypertension. Blood pressure 130/84. She has had it checked as an outpatient and has been borderline also. She admits to not exercising and gaining weight. Patient admits to  starting an excise program and we'll follow her blood pressure. Would develop 140/90 and above she knows treatment would be warranted. 3. Skin rash. Patient had been using Mytrex intermittently for periclitoral irritation with good results. She now has a rash along her right thigh/groin fold. Consistent with yeast. Treat with Mytrex cream twice a day as needed as well as Diflucan 150 mg daily x3 days. Followup if rash continues. 4. Contraception. Not currently sexually active. Again encouraged her to consider Mirena IUD. Patient will followup with her decision. 5. Mammography 02/2012. Patient will schedule this coming December. SBE monthly reviewed. 6. Pap smear 11/2011 ASCUS with positive high-risk HPV. Colposcopy was normal/adequate with no biopsies taken. Followup Pap smear HPV today. 7. Colonoscopy 2013. Followup with their recommended interval. 8. Health maintenance. Baseline CBC comprehensive metabolic panel lipid profile urinalysis TSH ordered. Followup with decision about menorrhagia/contraception above. Otherwise annually.  Note: This document was prepared with digital dictation and possible smart phrase technology. Any transcriptional errors that result from this process are unintentional.   Dara Lords MD, 3:02 PM 01/05/2013

## 2013-01-05 NOTE — Patient Instructions (Signed)
Followup with decision about heavy menses. Otherwise followup in one year for annual exam.

## 2013-01-06 LAB — URINALYSIS W MICROSCOPIC + REFLEX CULTURE
Casts: NONE SEEN
Hgb urine dipstick: NEGATIVE
Leukocytes, UA: NEGATIVE
Nitrite: NEGATIVE
Protein, ur: NEGATIVE mg/dL
pH: 6 (ref 5.0–8.0)

## 2013-04-24 ENCOUNTER — Other Ambulatory Visit: Payer: Self-pay | Admitting: Family Medicine

## 2013-04-25 ENCOUNTER — Other Ambulatory Visit: Payer: Self-pay

## 2013-04-25 MED ORDER — TRAMADOL HCL 50 MG PO TABS: ORAL_TABLET | ORAL | Status: DC

## 2013-04-25 NOTE — Telephone Encounter (Signed)
Called into pharmacy

## 2013-05-15 ENCOUNTER — Other Ambulatory Visit: Payer: Self-pay

## 2013-05-15 ENCOUNTER — Other Ambulatory Visit: Payer: Self-pay | Admitting: Family Medicine

## 2013-05-15 ENCOUNTER — Other Ambulatory Visit: Payer: Self-pay | Admitting: Gynecology

## 2013-05-15 ENCOUNTER — Encounter: Payer: Self-pay | Admitting: Family Medicine

## 2013-05-15 MED ORDER — ACYCLOVIR 400 MG PO TABS: 400.0000 mg | ORAL_TABLET | Freq: Two times a day (BID) | ORAL | Status: DC

## 2013-05-15 NOTE — Telephone Encounter (Signed)
This encounter was created in error - please disregard.

## 2013-05-22 ENCOUNTER — Encounter: Payer: Self-pay | Admitting: Family Medicine

## 2013-05-22 ENCOUNTER — Ambulatory Visit (INDEPENDENT_AMBULATORY_CARE_PROVIDER_SITE_OTHER): Payer: BC Managed Care – PPO | Admitting: Family Medicine

## 2013-05-22 VITALS — BP 140/80 | HR 78 | Temp 97.1°F | Resp 18 | Ht 64.0 in | Wt 222.0 lb

## 2013-05-22 DIAGNOSIS — F411 Generalized anxiety disorder: Secondary | ICD-10-CM

## 2013-05-22 DIAGNOSIS — N946 Dysmenorrhea, unspecified: Secondary | ICD-10-CM

## 2013-05-22 DIAGNOSIS — I10 Essential (primary) hypertension: Secondary | ICD-10-CM

## 2013-05-22 DIAGNOSIS — F418 Other specified anxiety disorders: Secondary | ICD-10-CM

## 2013-05-22 MED ORDER — IBUPROFEN 800 MG PO TABS: 800.0000 mg | ORAL_TABLET | Freq: Three times a day (TID) | ORAL | Status: DC | PRN

## 2013-05-22 MED ORDER — PROMETHAZINE HCL 25 MG PO TABS: 25.0000 mg | ORAL_TABLET | Freq: Four times a day (QID) | ORAL | Status: DC | PRN

## 2013-05-22 MED ORDER — ALPRAZOLAM 0.5 MG PO TABS: 0.5000 mg | ORAL_TABLET | Freq: Every evening | ORAL | Status: DC | PRN

## 2013-05-22 MED ORDER — TRAMADOL HCL 50 MG PO TABS: ORAL_TABLET | ORAL | Status: DC

## 2013-05-22 NOTE — Progress Notes (Signed)
Subjective:    Patient ID: Catherine Rodriguez, female    DOB: [birth year], 43 y.o.   MRN: [id number]  HPI Patient is here to reestablish care. I have not seen the patient since 2013. Patient has been seeing her gynecologist. She has severe menstrual cramps. She has as history of arthritis. She takes ibuprofen 800 mg as needed for pain for arthritis and menstrual cramps. She takes it less than 30 times per month. She also rarely uses tramadol 50 mg. She only takes this when she has a severe headache or severe menstrual cramps. She uses very infrequently. Chest has a history of generalized anxiety disorder and situational anxiety. She has occasional panic attacks which are infrequent. She takes Xanax sparingly for this. Her blood pressures elevated at 140/80. She also states that she has a history of elevated of her function test and has been told she has fatty liver disease. Past Medical History  Diagnosis Date  . IBS (irritable bowel syndrome)   . Herpes simplex   . GERD (gastroesophageal reflux disease)   . ASCUS with positive high risk HPV cervical 2013    normal colposcopy   Current Outpatient Prescriptions on File Prior to Visit  Medication Sig Dispense Refill  . acyclovir (ZOVIRAX) 400 MG tablet Take 1 tablet (400 mg total) by mouth 2 (two) times daily.  60 tablet  3  . amphetamine-dextroamphetamine (ADDERALL, 30MG ,) 30 MG tablet Take 30 mg by mouth 2 (two) times daily.        . mometasone (NASONEX) 50 MCG/ACT nasal spray Place 2 sprays into the nose daily.        Marland Kitchen nystatin-triamcinolone ointment (MYCOLOG) Apply topically 2 (two) times daily.  30 g  2   No current facility-administered medications on file prior to visit.   Allergies  Allergen Reactions  . Macrobid [Nitrofurantoin Macrocrystal] Nausea And Vomiting  . Sulfa Antibiotics Nausea Only   History   Social History  . Marital Status: Single    Spouse Name: N/A    Number of Children: N/A  . Years of Education: N/A    Occupational History  . Not on file.   Social History Main Topics  . Smoking status: Never Smoker   . Smokeless tobacco: Never Used  . Alcohol Use: Yes     Comment: Rare  . Drug Use: No  . Sexual Activity: Not Currently   Other Topics Concern  . Not on file   Social History Narrative  . No narrative on file      Review of Systems  All other systems reviewed and are negative.       Objective:   Physical Exam  Vitals reviewed. Constitutional: She appears well-developed and well-nourished. No distress.  Eyes: Conjunctivae are normal. Pupils are equal, round, and reactive to light.  Neck: Neck supple. No thyromegaly present.  Cardiovascular: Normal rate, regular rhythm and normal heart sounds.   No murmur heard. Pulmonary/Chest: Effort normal and breath sounds normal. No respiratory distress. She has no wheezes. She has no rales.  Abdominal: Soft. Bowel sounds are normal. She exhibits no distension. There is no tenderness. There is no rebound and no guarding.  Lymphadenopathy:    She has no cervical adenopathy.  Skin: She is not diaphoretic.          Assessment & Plan:  1. Situational anxiety I am okay given the patient Xanax 0.5 mg tablets. She can use one every 8 hours as needed. I gave her 30 tablets 0  refills. This should be more than enough to last 3-6 months  2. Menstrual cramps Again the patient trephination milligrams every 8 hours. I gave her 30 tablets. This should be enough to last one month. If she's using it more frequently worried about possible peptic ulcer disease.  I also gave the patient 30 tramadol. This should last 3-6 months  3. HTN (hypertension) Patient's blood pressure is elevated. I recommended diet exercise and 10-15 pounds weight loss. I like to recheck the patient's blood pressure in 3 months. If persistently elevated consider starting patient on medication. I also asked her to return fasting for CBC and fasting lipid panel and CMP. The  patient's liver function tests are elevated I would proceed with a viral hepatitis panel as well as right upper quadrant ultrasound. - CBC with Differential; Future - COMPLETE METABOLIC PANEL WITH GFR; Future - Lipid panel; Future

## 2013-05-30 ENCOUNTER — Other Ambulatory Visit: Payer: BC Managed Care – PPO

## 2013-12-06 ENCOUNTER — Other Ambulatory Visit: Payer: Self-pay | Admitting: Family Medicine

## 2013-12-06 NOTE — Telephone Encounter (Signed)
?   OK to Refill  - LOV and last refill was 05/22/13

## 2013-12-07 NOTE — Telephone Encounter (Signed)
ok 

## 2013-12-24 ENCOUNTER — Other Ambulatory Visit: Payer: Self-pay | Admitting: Family Medicine

## 2013-12-25 NOTE — Telephone Encounter (Signed)
Ok to refill 

## 2013-12-25 NOTE — Telephone Encounter (Signed)
Medication called to pharmacy. 

## 2013-12-25 NOTE — Telephone Encounter (Signed)
Ok to refill??  Last office visit/ refill 05/22/2013, #1 refill.

## 2014-01-10 ENCOUNTER — Encounter: Payer: Self-pay | Admitting: Gynecology

## 2014-01-22 ENCOUNTER — Encounter: Payer: Self-pay | Admitting: Family Medicine

## 2014-02-16 ENCOUNTER — Other Ambulatory Visit: Payer: Self-pay | Admitting: Family Medicine

## 2014-02-19 NOTE — Telephone Encounter (Signed)
Ok to refill??  Last office visit 05/22/2013.  Last refill 12/07/2013.

## 2014-02-19 NOTE — Telephone Encounter (Signed)
ok 

## 2014-06-22 ENCOUNTER — Encounter: Payer: Self-pay | Admitting: Family Medicine

## 2014-06-22 ENCOUNTER — Ambulatory Visit (INDEPENDENT_AMBULATORY_CARE_PROVIDER_SITE_OTHER): Payer: BLUE CROSS/BLUE SHIELD | Admitting: Family Medicine

## 2014-06-22 VITALS — BP 110/74 | HR 84 | Temp 98.4°F | Resp 18 | Wt 222.0 lb

## 2014-06-22 DIAGNOSIS — H7292 Unspecified perforation of tympanic membrane, left ear: Secondary | ICD-10-CM | POA: Diagnosis not present

## 2014-06-22 NOTE — Progress Notes (Signed)
Subjective:    Patient ID: Catherine Rodriguez, female    DOB: May 12, 1970, 44 y.o.   MRN: [id number]  HPI  Patient fell last night and struck her head on her sink while getting out of her shower.  She suffered bruising around both eyes a capillary hemorrhage in the left eye and has had hearing loss in her left ear ever since. On examination today she has a capillary hemorrhage in her left eye. On fluorescein examination there are no corneal abrasions or ulcers seen in either the left or right eye. Her eyesight is not affected. On examination, there is a perforation in the left tympanic membrane from 6:00 to 9:00. It is approximately 30% of the tympanic membrane. On hearing screen, the patient fails to here even 40 dB in the left ear. Past Medical History  Diagnosis Date  . IBS (irritable bowel syndrome)   . Herpes simplex   . GERD (gastroesophageal reflux disease)   . ASCUS with positive high risk HPV cervical 2013    normal colposcopy   Past Surgical History  Procedure Laterality Date  . Appendectomy  1993  . Eyellid  2010  . Tubes in ears      as a child  . Therapeutic abortion      X 2   Current Outpatient Prescriptions on File Prior to Visit  Medication Sig Dispense Refill  . ALPRAZolam (XANAX) 0.5 MG tablet TAKE ONE TABLET BY MOUTH TWICE DAILY 60 tablet 0  . amphetamine-dextroamphetamine (ADDERALL, 30MG ,) 30 MG tablet Take 30 mg by mouth 2 (two) times daily.      Marland Kitchen ibuprofen (ADVIL,MOTRIN) 800 MG tablet Take 1 tablet (800 mg total) by mouth every 8 (eight) hours as needed. 30 tablet 3  . promethazine (PHENERGAN) 25 MG tablet Take 1 tablet (25 mg total) by mouth every 6 (six) hours as needed. 30 tablet 5   No current facility-administered medications on file prior to visit.   Allergies  Allergen Reactions  . Macrobid [Nitrofurantoin Macrocrystal] Nausea And Vomiting  . Sulfa Antibiotics Nausea Only   History   Social History  . Marital Status: Single    Spouse Name: N/A    . Number of Children: N/A  . Years of Education: N/A   Occupational History  . Not on file.   Social History Main Topics  . Smoking status: Never Smoker   . Smokeless tobacco: Never Used  . Alcohol Use: Yes     Comment: Rare  . Drug Use: No  . Sexual Activity: Not Currently   Other Topics Concern  . Not on file   Social History Narrative     Review of Systems  All other systems reviewed and are negative.      Objective:   Physical Exam  HENT:  Right Ear: Tympanic membrane is not injected, not scarred, not perforated, not erythematous and not retracted.  Left Ear: Tympanic membrane is perforated. Tympanic membrane is not erythematous, not retracted and not bulging. Tympanic membrane mobility is abnormal. No hemotympanum. Decreased hearing is noted.  Neck: Neck supple.  Cardiovascular: Normal rate, regular rhythm and normal heart sounds.   Pulmonary/Chest: Effort normal and breath sounds normal.  Lymphadenopathy:    She has no cervical adenopathy.  Vitals reviewed.         Assessment & Plan:  Perforated ear drum, left - Plan: Ambulatory referral to ENT  Given the size greater than 25%, and more than 40 dB hearing loss in left ear, I  will refer the patient to a near nose and throat physician to discuss possible operative closure of the perforation to her left tympanic membrane there is no evidence of a skull fracture. There is no evidence of a corneal abrasion. The patient denies any domestic abuse or violence.

## 2014-06-25 ENCOUNTER — Telehealth: Payer: Self-pay | Admitting: Family Medicine
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
## 2014-06-25 NOTE — Telephone Encounter (Signed)
LMTRC
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
## 2014-07-13 ENCOUNTER — Other Ambulatory Visit: Payer: Self-pay | Admitting: Family Medicine

## 2014-07-13 NOTE — Telephone Encounter (Signed)
Medication refilled per protocol. 

## 2014-07-18 ENCOUNTER — Other Ambulatory Visit: Payer: Self-pay | Admitting: Family Medicine

## 2014-08-22 ENCOUNTER — Encounter: Payer: Self-pay | Admitting: Family Medicine

## 2015-01-07 ENCOUNTER — Ambulatory Visit (INDEPENDENT_AMBULATORY_CARE_PROVIDER_SITE_OTHER): Payer: BLUE CROSS/BLUE SHIELD | Admitting: Family Medicine

## 2015-01-07 ENCOUNTER — Encounter: Payer: Self-pay | Admitting: Family Medicine

## 2015-01-07 VITALS — BP 128/72 | HR 68 | Temp 98.5°F | Resp 14 | Ht 64.0 in | Wt 224.0 lb

## 2015-01-07 DIAGNOSIS — J069 Acute upper respiratory infection, unspecified: Secondary | ICD-10-CM

## 2015-01-07 DIAGNOSIS — J019 Acute sinusitis, unspecified: Secondary | ICD-10-CM | POA: Diagnosis not present

## 2015-01-07 MED ORDER — HYDROCOD POLST-CPM POLST ER 10-8 MG/5ML PO SUER: 5.0000 mL | Freq: Two times a day (BID) | ORAL | Status: DC | PRN

## 2015-01-07 MED ORDER — AMOXICILLIN 875 MG PO TABS: 875.0000 mg | ORAL_TABLET | Freq: Two times a day (BID) | ORAL | Status: DC

## 2015-01-07 MED ORDER — FLUCONAZOLE 150 MG PO TABS: 150.0000 mg | ORAL_TABLET | Freq: Once | ORAL | Status: DC

## 2015-01-07 NOTE — Progress Notes (Signed)
Patient ID: Catherine Rodriguez, female   DOB: 07-15-1970, 44 y.o.   MRN: [id number]   Subjective:    Patient ID: Catherine Rodriguez, female    DOB: 01-13-1971, 44 y.o.   MRN: [id number]  Patient presents for Illness  Pt here with sinus pressure, drainage, worse over past 3 weeks, now has cough that feels like irritant in throat and coughing up thick mucous from back of throat. No wheeze, no SOB, non smoker, no recent fever. Taking Dayquil products.  History of sinus and allergy issues    Review Of Systems:  GEN- denies fatigue, fever, weight loss,weakness, recent illness HEENT- denies eye drainage, change in vision,+ nasal discharge, CVS- denies chest pain, palpitations RESP- denies SOB,+ cough, wheeze ABD- denies N/V, change in stools, abd pain Neuro- denies headache, dizziness, syncope, seizure activity       Objective:    BP 128/72 mmHg  Pulse 68  Temp(Src) 98.5 F (36.9 C) (Oral)  Resp 14  Ht 5\' 4"  (1.626 m)  Wt 224 lb (101.606 kg)  BMI 38.43 kg/m2 GEN- NAD, alert and oriented x3 HEENT- PERRL, EOMI, non injected sclera, pink conjunctiva, MMM, oropharynx mild injection, TM clear bilat no effusion,  + maxillary sinus tenderness, inflammed turbinates,  Nasal drainage  Neck- Supple, no LAD CVS- RRR, no murmur RESP-CTAB EXT- No edema Pulses- Radial 2+          Assessment & Plan:      Problem List Items Addressed This Visit    None    Visit Diagnoses    Acute URI    -  Primary    Sinusitis now with URI, bronchial irritation from drainage, amox, flonase, tussionx for cough    Relevant Medications    fluconazole (DIFLUCAN) 150 MG tablet    Acute rhinosinusitis        Relevant Medications    fluconazole (DIFLUCAN) 150 MG tablet    chlorpheniramine-HYDROcodone (TUSSIONEX PENNKINETIC ER) 10-8 MG/5ML SUER    amoxicillin (AMOXIL) 875 MG tablet       Note: This dictation was prepared with Dragon dictation along with smaller phrase technology. Any transcriptional  errors that result from this process are unintentional.

## 2015-01-07 NOTE — Patient Instructions (Signed)
Take antibiotics Use nasal steroid Cough syrup given F/U as needed

## 2015-01-14 ENCOUNTER — Telehealth: Payer: Self-pay | Admitting: Family Medicine

## 2015-01-14 MED ORDER — AMOXICILLIN-POT CLAVULANATE 875-125 MG PO TABS: 1.0000 | ORAL_TABLET | Freq: Two times a day (BID) | ORAL | Status: DC

## 2015-01-14 NOTE — Telephone Encounter (Signed)
Call placed to patient. LMTRC.  

## 2015-01-14 NOTE — Telephone Encounter (Signed)
Pt left a message that she is not doing any better. She also wanted to let us know that she accidentally dropped half of her antibiotics in the toilet.  Please call [phone]

## 2015-01-14 NOTE — Telephone Encounter (Signed)
I would forward to Dr. Keturah Barre

## 2015-01-14 NOTE — Telephone Encounter (Signed)
Send over Augmentin BID x 7 days, advise I am changing so insurance will cover its a little stronger than regular amox

## 2015-01-15 NOTE — Telephone Encounter (Signed)
Call placed to patient and patient made aware.  

## 2015-01-16 ENCOUNTER — Encounter: Payer: Self-pay | Admitting: Gynecology

## 2015-01-16 ENCOUNTER — Other Ambulatory Visit (HOSPITAL_COMMUNITY)
Admission: RE | Admit: 2015-01-16 | Discharge: 2015-01-16 | Disposition: A | Discharge status: Home / Self Care | Payer: BLUE CROSS/BLUE SHIELD | Source: Ambulatory Visit | Attending: Gynecology | Admitting: Gynecology

## 2015-01-16 ENCOUNTER — Ambulatory Visit (INDEPENDENT_AMBULATORY_CARE_PROVIDER_SITE_OTHER): Payer: BLUE CROSS/BLUE SHIELD | Admitting: Gynecology

## 2015-01-16 VITALS — BP 138/88 | Ht 65.0 in | Wt 225.0 lb

## 2015-01-16 DIAGNOSIS — N946 Dysmenorrhea, unspecified: Secondary | ICD-10-CM

## 2015-01-16 DIAGNOSIS — Z01419 Encounter for gynecological examination (general) (routine) without abnormal findings: Secondary | ICD-10-CM | POA: Diagnosis not present

## 2015-01-16 DIAGNOSIS — Z1151 Encounter for screening for human papillomavirus (HPV): Secondary | ICD-10-CM | POA: Diagnosis not present

## 2015-01-16 DIAGNOSIS — Z23 Encounter for immunization: Secondary | ICD-10-CM

## 2015-01-16 MED ORDER — IBUPROFEN 800 MG PO TABS: 800.0000 mg | ORAL_TABLET | Freq: Three times a day (TID) | ORAL | Status: DC | PRN

## 2015-01-16 NOTE — Patient Instructions (Signed)

## 2015-01-17 NOTE — Progress Notes (Signed)
Catherine Rodriguez 21-Nov-1970 [id number]        44 y.o.  G2P0020 for annual exam.  Several issues noted below.  Past medical history,surgical history, problem list, medications, allergies, family history and social history were all reviewed and documented as reviewed in the EPIC chart.  ROS:  Performed with pertinent positives and negatives included in the history, assessment and plan.   Additional significant findings :  none   Exam: Kim Counsellor Vitals:   01/16/15 1425  BP: 138/88  Height: 5\' 5"  (1.651 m)  Weight: 225 lb (102.059 kg)   General appearance:  Normal affect, orientation and appearance. Skin: Grossly normal HEENT: Without gross lesions.  No cervical or supraclavicular adenopathy. Thyroid normal.  Lungs:  Clear without wheezing, rales or rhonchi Cardiac: RR, without RMG Abdominal:  Soft, nontender, without masses, guarding, rebound, organomegaly or hernia Breasts:  Examined lying and sitting without masses, retractions, discharge or axillary adenopathy. Pelvic:  Ext/BUS/vagina normal  Cervix normal.  Pap smear/HPV  Uterus anteverted, normal size, shape and contour, midline and mobile nontender   Adnexa  Without masses or tenderness    Anus and perineum  Normal   Rectovaginal  Normal sphincter tone without palpated masses or tenderness.    Assessment/Plan:  44 y.o. G55P0020 female for annual exam regular menses, not sexually active.   1. Menorrhagia/dysmenorrhea. Patient's periods remained relatively heavy with cramping. She takes 800 mg of ibuprofen which seems to help with this. Had a negative sonohysterogram and biopsy several years ago. Options for management to include repeat studies, intervention such as IUD, ablation, up to and including hysterectomy reviewed. At this point patient does not feel it warrants anything further than ibuprofen. Ibuprofen 800 mg #90 with 4 refills provided. 2. Contraception. Patient not sexually active and does not plan to be any  time soon. Need for contraception when she becomes sexually active. 3. Pap smear/HPV negative 2014.  Pap smear/HPV today. History of ASCUS with positive HPV 2013 with normal colposcopy. 4. History of HSV. Uses Valtrex intermittently. Occasional outbreaks. Has supply at home and will call if needs more. 5. Mammography 12/2013. Patient knows to schedule and agrees to do so. SBE monthly reviewed. 6. Colonoscopy 2013. Repeat at their recommended interval. 7. Health maintenance.  Continues with borderline hypertension with blood pressure 138/88. Is being followed actively by her primary by her history.  No routine blood work done as she reports this done at her primary physician's office.  Follow up 1 year, sooner as needed.   Anastasio Auerbach MD, 7:59 AM 01/17/2015

## 2015-01-18 LAB — CYTOLOGY - PAP

## 2015-04-17 ENCOUNTER — Encounter: Payer: Self-pay | Admitting: Gynecology

## 2015-06-03 ENCOUNTER — Ambulatory Visit (INDEPENDENT_AMBULATORY_CARE_PROVIDER_SITE_OTHER): Payer: BLUE CROSS/BLUE SHIELD | Admitting: Physician Assistant

## 2015-06-03 ENCOUNTER — Encounter: Payer: Self-pay | Admitting: Physician Assistant

## 2015-06-03 VITALS — BP 118/80 | HR 88 | Temp 98.1°F | Resp 18 | Wt 228.0 lb

## 2015-06-03 DIAGNOSIS — J988 Other specified respiratory disorders: Secondary | ICD-10-CM | POA: Diagnosis not present

## 2015-06-03 DIAGNOSIS — J029 Acute pharyngitis, unspecified: Secondary | ICD-10-CM | POA: Diagnosis not present

## 2015-06-03 DIAGNOSIS — Z20828 Contact with and (suspected) exposure to other viral communicable diseases: Secondary | ICD-10-CM

## 2015-06-03 DIAGNOSIS — B9689 Other specified bacterial agents as the cause of diseases classified elsewhere: Secondary | ICD-10-CM

## 2015-06-03 LAB — INFLUENZA A AND B AG, IMMUNOASSAY
INFLUENZA A ANTIGEN: NOT DETECTED
Influenza B Antigen: NOT DETECTED

## 2015-06-03 LAB — STREP GROUP A AG, W/REFLEX TO CULT: STREGTOCOCCUS GROUP A AG SCREEN: NOT DETECTED

## 2015-06-03 MED ORDER — PROMETHAZINE HCL 25 MG PO TABS: 25.0000 mg | ORAL_TABLET | Freq: Four times a day (QID) | ORAL | Status: DC | PRN

## 2015-06-03 MED ORDER — AZITHROMYCIN 250 MG PO TABS
ORAL_TABLET | ORAL | Status: DC
Start: 2015-06-03 — End: 2016-01-17

## 2015-06-03 MED ORDER — HYDROCODONE-HOMATROPINE 5-1.5 MG/5ML PO SYRP: 5.0000 mL | ORAL_SOLUTION | Freq: Three times a day (TID) | ORAL | Status: DC | PRN

## 2015-06-03 MED ORDER — ALPRAZOLAM 0.5 MG PO TABS: 0.5000 mg | ORAL_TABLET | Freq: Two times a day (BID) | ORAL | Status: DC

## 2015-06-03 NOTE — Progress Notes (Signed)
Patient ID: CHAYIL GENO MRN: [id number], DOB: Dec 14, 1970, 45 y.o. Date of Encounter: 06/03/2015, 4:42 PM    Chief Complaint:  Chief Complaint  Patient presents with  . sick x 2 weeks    cough, sore throat  cough worse at night  . Medication Refill    xanax and phenergan     HPI: 45 y.o. year old white female presents with above.   Patient states that she has been sick for 2 weeks. Says that she does have a lot of cough but also congestion in her head and nose and sinus headache. Is unable to get much sleep secondary to cough. No known fevers.  Is that she uses Phenergan occasionally for nausea associated with her IBS. Says that this is long-term problem and she just uses the Phenergan occasionally.  Says that she uses the Xanax once every 1-2 weeks on average.  No other complaints or concerns.     Home Meds:   Outpatient Prescriptions Prior to Visit  Medication Sig Dispense Refill  . acyclovir (ZOVIRAX) 800 MG tablet Take 800 mg by mouth 5 (five) times daily.    Marland Kitchen amphetamine-dextroamphetamine (ADDERALL, 30MG ,) 30 MG tablet Take 30 mg by mouth 2 (two) times daily.      . eszopiclone (LUNESTA) 1 MG TABS tablet Take 1 mg by mouth at bedtime as needed for sleep. Take immediately before bedtime    . ibuprofen (ADVIL,MOTRIN) 800 MG tablet Take 1 tablet (800 mg total) by mouth every 8 (eight) hours as needed. 90 tablet 4  . ALPRAZolam (XANAX) 0.5 MG tablet TAKE ONE TABLET BY MOUTH TWICE DAILY 60 tablet 0  . promethazine (PHENERGAN) 25 MG tablet TAKE ONE TABLET BY MOUTH EVERY 6 HOURS AS NEEDED 30 tablet 0  . amoxicillin-clavulanate (AUGMENTIN) 875-125 MG tablet Take 1 tablet by mouth 2 (two) times daily. 14 tablet 0   No facility-administered medications prior to visit.    Allergies:  Allergies  Allergen Reactions  . Macrobid [Nitrofurantoin Macrocrystal] Nausea And Vomiting  . Penicillins     Got real sick  . Sulfa Antibiotics Nausea Only      Review of Systems:  See HPI for pertinent ROS. All other ROS negative.    Physical Exam: Blood pressure 118/80, pulse 88, temperature 98.1 F (36.7 C), temperature source Oral, resp. rate 18, weight 228 lb (103.42 kg)., Body mass index is 37.94 kg/(m^2). General:  WF. Appears in no acute distress. HEENT: Normocephalic, atraumatic, eyes without discharge, sclera non-icteric, nares are without discharge. Bilateral auditory canals clear, TM's are without perforation, pearly grey and translucent with reflective cone of light bilaterally. Oral cavity moist, posterior pharynx without exudate, erythema, peritonsillar abscess. No tenderness with percussion to frontal or maxillary sinuses bilaterally.  Neck: Supple. No thyromegaly. No lymphadenopathy. Lungs: Clear bilaterally to auscultation without wheezes, rales, or rhonchi. Breathing is unlabored. Heart: Regular rhythm. No murmurs, rubs, or gallops. Msk:  Strength and tone normal for age. Extremities/Skin: Warm and dry.  Neuro: Alert and oriented X 3. Moves all extremities spontaneously. Gait is normal. CNII-XII grossly in tact. Psych:  Responds to questions appropriately with a normal affect.     ASSESSMENT AND PLAN:  45 y.o. year old female with  1. Bacterial respiratory infection Is to take the antibiotic as directed. Can you is Hycodan as cough suppressant so she can sleep. Over-the-counter medications for other symptom relief. Follow-up if symptoms do not resolve within 1 week after completion of antibiotic. - azithromycin (ZITHROMAX) 250  MG tablet; Day 1: Take 2 daily. Days 2-5: Take 1 daily.  Dispense: 6 tablet; Refill: 0 - HYDROcodone-homatropine (HYCODAN) 5-1.5 MG/5ML syrup; Take 5 mLs by mouth every 8 (eight) hours as needed for cough.  Dispense: 120 mL; Refill: 0  2. Sorethroat - STREP GROUP A AG, W/REFLEX TO CULT  3. Exposure to influenza - Influenza A and B Ag, Immunoassay  Also sent in prescription for Phenergan No. 30 +0. Also printed  prescription for Xanax for #30+0.   Marin Olp [place], [place], [facility] 06/03/2015 4:42 PM

## 2016-01-17 ENCOUNTER — Ambulatory Visit (INDEPENDENT_AMBULATORY_CARE_PROVIDER_SITE_OTHER): Payer: BLUE CROSS/BLUE SHIELD | Admitting: Gynecology

## 2016-01-17 ENCOUNTER — Other Ambulatory Visit: Payer: Self-pay | Admitting: Gynecology

## 2016-01-17 ENCOUNTER — Encounter: Payer: Self-pay | Admitting: Gynecology

## 2016-01-17 VITALS — BP 122/78 | Ht 65.0 in | Wt 213.0 lb

## 2016-01-17 DIAGNOSIS — Z23 Encounter for immunization: Secondary | ICD-10-CM

## 2016-01-17 DIAGNOSIS — Z13220 Encounter for screening for lipoid disorders: Secondary | ICD-10-CM

## 2016-01-17 DIAGNOSIS — Z01419 Encounter for gynecological examination (general) (routine) without abnormal findings: Secondary | ICD-10-CM

## 2016-01-17 LAB — CBC WITH DIFFERENTIAL/PLATELET
BASOS ABS: 0 {cells}/uL (ref 0–200)
BASOS PCT: 0 %
EOS ABS: 60 {cells}/uL (ref 15–500)
Eosinophils Relative: 1 %
HEMATOCRIT: 42.5 % (ref 35.0–45.0)
Hemoglobin: 14.5 g/dL (ref 11.7–15.5)
LYMPHS PCT: 32 %
Lymphs Abs: 1920 cells/uL (ref 850–3900)
MCH: 30.2 pg (ref 27.0–33.0)
MCHC: 34.1 g/dL (ref 32.0–36.0)
MCV: 88.5 fL (ref 80.0–100.0)
MONO ABS: 480 {cells}/uL (ref 200–950)
MONOS PCT: 8 %
MPV: 10.8 fL (ref 7.5–12.5)
Neutro Abs: 3540 cells/uL (ref 1500–7800)
Neutrophils Relative %: 59 %
Platelets: 297 10*3/uL (ref 140–400)
RBC: 4.8 MIL/uL (ref 3.80–5.10)
RDW: 13.1 % (ref 11.0–15.0)
WBC: 6 10*3/uL (ref 3.8–10.8)

## 2016-01-17 MED ORDER — IBUPROFEN 800 MG PO TABS: 800.0000 mg | ORAL_TABLET | Freq: Three times a day (TID) | ORAL | 4 refills | Status: DC | PRN

## 2016-01-17 NOTE — Addendum Note (Signed)
Addended by: Nelva Nay on: 01/17/2016 03:20 PM   Modules accepted: Orders

## 2016-01-17 NOTE — Progress Notes (Signed)
    Catherine Rodriguez February 25, 1971 [id number]        45 y.o.  G2P0020  for annual exam.  Overall doing well without complaints.  Past medical history,surgical history, problem list, medications, allergies, family history and social history were all reviewed and documented as reviewed in the EPIC chart.  ROS:  Performed with pertinent positives and negatives included in the history, assessment and plan.   Additional significant findings :  None   Exam: Catherine Rodriguez assistant Vitals:   01/17/16 1432  BP: 122/78  Weight: 213 lb (96.6 kg)  Height: 5\' 5"  (1.651 m)   Body mass index is 35.45 kg/m.  General appearance:  Normal affect, orientation and appearance. Skin: Grossly normal HEENT: Without gross lesions.  No cervical or supraclavicular adenopathy. Thyroid normal.  Lungs:  Clear without wheezing, rales or rhonchi Cardiac: RR, without RMG Abdominal:  Soft, nontender, without masses, guarding, rebound, organomegaly or hernia Breasts:  Examined lying and sitting without masses, retractions, discharge or axillary adenopathy. Pelvic:  Ext, BUS, Vagina normal  Cervix normal  Uterus anteverted, normal size, shape and contour, midline and mobile nontender   Adnexa without masses or tenderness    Anus and perineum normal   Rectovaginal normal sphincter tone without palpated masses or tenderness.    Assessment/Plan:  45 y.o. G47P0020 female for annual exam with regular menses, abstinent contraception.   1. Menorrhagia/dysmenorrhea. Uses ibuprofen 800 mg every 8 hours as needed. Negative sonohysterogram and biopsy previously. Options reviewed for management to include IUD, ablation up to including hysterectomy. Patient's comfortable continuing the ibuprofen which seems to handle her discomfort. Ibuprofen 800 mg #90 with 4 refills provided. 2. Contraception. Not sexually active and does not plan to come so. Knows the need to address this if she does. 3. History of HSV. Has not had an  outbreak in a while. Will call if she needs Valtrex. 4. Pap smear/HPV 2016 negative.  No Pap smear done today. History of ASCUS with positive HPV 2013. Has had 2 Pap smears since with negative HPV and normal cytology. 5. Mammography 03/2015. Continue with annual mammography when due. SBE monthly reviewed. 6. Colonoscopy 2013. Repeat at their recommended interval. 7. Health maintenance. Baseline CBC, CMP, lipid profile, urinalysis ordered. Follow up 1 year, sooner as needed.   Anastasio Auerbach MD, 3:08 PM 01/17/2016

## 2016-01-17 NOTE — Patient Instructions (Signed)

## 2016-01-18 LAB — LIPID PANEL
CHOL/HDL RATIO: 2.8 ratio (ref <=5.0)
CHOLESTEROL: 169 mg/dL (ref 125–200)
HDL: 60 mg/dL (ref >=46)
LDL Cholesterol: 87 mg/dL (ref <130)
Triglycerides: 111 mg/dL (ref <150)
VLDL: 22 mg/dL (ref <30)

## 2016-01-18 LAB — URINALYSIS W MICROSCOPIC + REFLEX CULTURE
BILIRUBIN URINE: NEGATIVE
Casts: NONE SEEN [LPF]
Crystals: NONE SEEN [HPF]
GLUCOSE, UA: NEGATIVE
Ketones, ur: NEGATIVE
Nitrite: NEGATIVE
PH: 6 (ref 5.0–8.0)
Protein, ur: NEGATIVE
SPECIFIC GRAVITY, URINE: 1.025 (ref 1.001–1.035)
Yeast: NONE SEEN [HPF]

## 2016-01-18 LAB — COMPREHENSIVE METABOLIC PANEL
ALK PHOS: 76 U/L (ref 33–115)
ALT: 16 U/L (ref 6–29)
AST: 22 U/L (ref 10–35)
Albumin: 4.2 g/dL (ref 3.6–5.1)
BUN: 15 mg/dL (ref 7–25)
CALCIUM: 9.6 mg/dL (ref 8.6–10.2)
CHLORIDE: 101 mmol/L (ref 98–110)
CO2: 24 mmol/L (ref 20–31)
Creat: 0.91 mg/dL (ref 0.50–1.10)
Glucose, Bld: 76 mg/dL (ref 65–99)
POTASSIUM: 4.1 mmol/L (ref 3.5–5.3)
Sodium: 137 mmol/L (ref 135–146)
TOTAL PROTEIN: 7.2 g/dL (ref 6.1–8.1)
Total Bilirubin: 0.3 mg/dL (ref 0.2–1.2)

## 2016-01-20 LAB — URINE CULTURE

## 2016-01-22 ENCOUNTER — Other Ambulatory Visit: Payer: Self-pay | Admitting: *Deleted

## 2016-01-22 MED ORDER — CIPROFLOXACIN HCL 250 MG PO TABS: 250.0000 mg | ORAL_TABLET | Freq: Two times a day (BID) | ORAL | 0 refills | Status: DC

## 2016-02-03 ENCOUNTER — Telehealth: Payer: Self-pay | Admitting: *Deleted

## 2016-02-03 MED ORDER — CIPROFLOXACIN HCL 250 MG PO TABS: 250.0000 mg | ORAL_TABLET | Freq: Two times a day (BID) | ORAL | 0 refills | Status: DC

## 2016-02-03 NOTE — Telephone Encounter (Signed)
ciprofloxacin 250 mg twice a day 7 days #14

## 2016-02-03 NOTE — Telephone Encounter (Signed)
Pt was prescribed Cipro 250 x 3 days on 01/20/16 completed Rx, states frequency, lower back discomfort has returned. Pt asked if refill could be given? Please advise

## 2016-02-03 NOTE — Telephone Encounter (Signed)
Left on voicemail Rx sent to pharmacy.

## 2016-03-18 ENCOUNTER — Ambulatory Visit (INDEPENDENT_AMBULATORY_CARE_PROVIDER_SITE_OTHER): Payer: BLUE CROSS/BLUE SHIELD | Admitting: Physician Assistant

## 2016-03-18 ENCOUNTER — Encounter: Payer: Self-pay | Admitting: Physician Assistant

## 2016-03-18 VITALS — BP 120/84 | HR 64 | Temp 98.2°F | Resp 18 | Wt 206.0 lb

## 2016-03-18 DIAGNOSIS — R300 Dysuria: Secondary | ICD-10-CM | POA: Diagnosis not present

## 2016-03-18 DIAGNOSIS — B9689 Other specified bacterial agents as the cause of diseases classified elsewhere: Secondary | ICD-10-CM

## 2016-03-18 DIAGNOSIS — N390 Urinary tract infection, site not specified: Secondary | ICD-10-CM

## 2016-03-18 DIAGNOSIS — J988 Other specified respiratory disorders: Secondary | ICD-10-CM

## 2016-03-18 LAB — URINALYSIS, MICROSCOPIC ONLY
CASTS: NONE SEEN [LPF]
CRYSTALS: NONE SEEN [HPF]
Yeast: NONE SEEN [HPF]

## 2016-03-18 LAB — URINALYSIS, ROUTINE W REFLEX MICROSCOPIC
Bilirubin Urine: NEGATIVE
Glucose, UA: NEGATIVE
Hgb urine dipstick: NEGATIVE
KETONES UR: NEGATIVE
NITRITE: NEGATIVE
PH: 6 (ref 5.0–8.0)
Protein, ur: NEGATIVE
SPECIFIC GRAVITY, URINE: 1.02 (ref 1.001–1.035)

## 2016-03-18 MED ORDER — AZITHROMYCIN 250 MG PO TABS
ORAL_TABLET | ORAL | 0 refills | Status: DC
Start: 2016-03-18 — End: 2016-10-02

## 2016-03-18 MED ORDER — HYDROCOD POLST-CPM POLST ER 10-8 MG/5ML PO SUER: 5.0000 mL | Freq: Two times a day (BID) | ORAL | 0 refills | Status: DC | PRN

## 2016-03-18 MED ORDER — CIPROFLOXACIN HCL 500 MG PO TABS: 500.0000 mg | ORAL_TABLET | Freq: Two times a day (BID) | ORAL | 0 refills | Status: DC

## 2016-03-18 NOTE — Progress Notes (Signed)
Patient ID: Catherine Rodriguez MRN: [id number], DOB: 1970/07/07, 45 y.o. Date of Encounter: @DATE @  Chief Complaint:  Chief Complaint  Patient presents with  . Cough    HPI: 45 y.o. year old female  presents with above.   States that symptoms started 7 days ago. Had some hoarseness then sore throat then those symptoms resolved. Then started getting yellow-green mucus from her nose as well as yellow-green phlegm from her chest. Is having a lot of cough at night. Has used over-the-counter cough medicine with minimal relief. Used Tussionex in the past and that worked well--is requesting refill on that. No known fevers or chills.  Also is concerned she has another urine infection. Has had some recent UTIs treated by her GYN. Says that when they found her first one they told her that "it looked like she had had it a while "but the only symptom she had at that time was urinary frequency. Today she reports that just yesterday she started developing increased urinary frequency. Having no dysuria. No fevers or chills. No back pain.  No other complaints or concerns.   Past Medical History:  Diagnosis Date  . ASCUS with positive high risk HPV cervical 2013   normal colposcopy  . GERD (gastroesophageal reflux disease)   . Herpes simplex   . IBS (irritable bowel syndrome)      Home Meds: Outpatient Medications Prior to Visit  Medication Sig Dispense Refill  . acyclovir (ZOVIRAX) 800 MG tablet Take 800 mg by mouth 5 (five) times daily.    Marland Kitchen ALPRAZolam (XANAX) 0.5 MG tablet Take 1 tablet (0.5 mg total) by mouth 2 (two) times daily. 30 tablet 0  . amphetamine-dextroamphetamine (ADDERALL, 30MG ,) 30 MG tablet Take 30 mg by mouth 2 (two) times daily.      Marland Kitchen desvenlafaxine (PRISTIQ) 50 MG 24 hr tablet Take 50 mg by mouth daily.    . eszopiclone (LUNESTA) 1 MG TABS tablet Take 1 mg by mouth at bedtime as needed for sleep. Take immediately before bedtime    . ibuprofen (ADVIL,MOTRIN) 800 MG tablet  Take 1 tablet (800 mg total) by mouth every 8 (eight) hours as needed. 90 tablet 4  . promethazine (PHENERGAN) 25 MG tablet Take 1 tablet (25 mg total) by mouth every 6 (six) hours as needed. 30 tablet 0  . ciprofloxacin (CIPRO) 250 MG tablet Take 1 tablet (250 mg total) by mouth 2 (two) times daily. (Patient not taking: Reported on 03/18/2016) 14 tablet 0   No facility-administered medications prior to visit.     Allergies:  Allergies  Allergen Reactions  . Macrobid [Nitrofurantoin Macrocrystal] Nausea And Vomiting  . Penicillins     Got real sick  . Sulfa Antibiotics Nausea Only    Social History   Social History  . Marital status: Single    Spouse name: N/A  . Number of children: N/A  . Years of education: N/A   Occupational History  . Not on file.   Social History Main Topics  . Smoking status: Never Smoker  . Smokeless tobacco: Never Used  . Alcohol use No  . Drug use: No  . Sexual activity: Not Currently     Comment: 1st intercourse 45 yo-Fewer than 5 partners   Other Topics Concern  . Not on file   Social History Narrative  . No narrative on file    Family History  Problem Relation Age of Onset  . Breast cancer Sister 35  . Hypertension Father   .  Diabetes Paternal Grandmother   . Asthma Mother      Review of Systems:  See HPI for pertinent ROS. All other ROS negative.    Physical Exam: Blood pressure 120/84, pulse 64, temperature 98.2 F (36.8 C), temperature source Oral, resp. rate 18, weight 206 lb (93.4 kg), last menstrual period 03/04/2016, SpO2 98 %., Body mass index is 34.28 kg/m. General: WF Appears in no acute distress. Head: Normocephalic, atraumatic, eyes without discharge, sclera non-icteric, nares are without discharge. Bilateral auditory canals clear, TM's are without perforation, pearly grey and translucent with reflective cone of light bilaterally. Oral cavity moist, posterior pharynx without exudate, erythema, peritonsillar abscess.  Minimal tenderness with percussion to frontal or maxillary sinuses bilaterally.  Neck: Supple. No thyromegaly. No lymphadenopathy. Lungs: Clear bilaterally to auscultation without wheezes, rales, or rhonchi. Breathing is unlabored. Heart: RRR with S1 S2. No murmurs, rubs, or gallops. Abdomen: Soft, non-tender, non-distended with normoactive bowel sounds. No hepatomegaly. No rebound/guarding. No obvious abdominal masses. Musculoskeletal:  Strength and tone normal for age. No tenderness with percussion to costophrenic angles bilaterally. Extremities/Skin: Warm and dry.  Neuro: Alert and oriented X 3. Moves all extremities spontaneously. Gait is normal. CNII-XII grossly in tact. Psych:  Responds to questions appropriately with a normal affect.   Results for orders placed or performed in visit on 03/18/16  Urinalysis, Routine w reflex microscopic  Result Value Ref Range   Color, Urine YELLOW YELLOW   APPearance CLEAR CLEAR   Specific Gravity, Urine 1.020 1.001 - 1.035   pH 6.0 5.0 - 8.0   Glucose, UA NEGATIVE NEGATIVE   Bilirubin Urine NEGATIVE NEGATIVE   Ketones, ur NEGATIVE NEGATIVE   Hgb urine dipstick NEGATIVE NEGATIVE   Protein, ur NEGATIVE NEGATIVE   Nitrite NEGATIVE NEGATIVE   Leukocytes, UA TRACE (A) NEGATIVE  Urine Microscopic  Result Value Ref Range   WBC, UA 6-10 (A) <=5 WBC/HPF   RBC / HPF 0-2 <=2 RBC/HPF   Squamous Epithelial / LPF 6-10 (A) <=5 HPF   Bacteria, UA MODERATE (A) NONE SEEN HPF   Crystals NONE SEEN NONE SEEN HPF   Casts NONE SEEN NONE SEEN LPF   Yeast NONE SEEN NONE SEEN HPF   Urine-Other MUCUS      ASSESSMENT AND PLAN:  45 y.o. year old female with  1. Bacterial respiratory infection She states that azithromycin has worked well for her in the past. Also is requesting test and ask as this has been the only thing that suppressed her cough in the past. - chlorpheniramine-HYDROcodone (TUSSIONEX PENNKINETIC ER) 10-8 MG/5ML SUER; Take 5 mLs by mouth every 12  (twelve) hours as needed for cough.  Dispense: 140 mL; Refill: 0 - azithromycin (ZITHROMAX) 250 MG tablet; Day 1: Take 2 daily for 1 day. Days 2-5: Take 1 daily.  Dispense: 6 tablet; Refill: 0  2. Urinary tract infection without hematuria, site unspecified She has allergy to Macrobid sulfa and penicillin. Was trying to use just one antibiotic to cover her respiratory infection as well as UTI but Levaquin was the only antibiotic that I could think of that would cover both--- and that she is not allergic to. Do not want her to build resistance to this so going with separate Z-Pak and Cipro. As well will send culture to make sure sensitive to Cipro. - Urine culture - ciprofloxacin (CIPRO) 500 MG tablet; Take 1 tablet (500 mg total) by mouth 2 (two) times daily.  Dispense: 14 tablet; Refill: 0  3. Burning with urination  -  Urinalysis, Routine w reflex microscopic   Signed, [address] [place], [place], [facility] 03/18/2016 12:58 PM

## 2016-03-20 LAB — URINE CULTURE: Colony Count: >=100000

## 2016-03-25 ENCOUNTER — Encounter: Payer: Self-pay | Admitting: *Deleted

## 2016-03-25 ENCOUNTER — Telehealth: Payer: Self-pay | Admitting: Family Medicine

## 2016-03-25 MED ORDER — DOXYCYCLINE HYCLATE 100 MG PO TABS: 100.0000 mg | ORAL_TABLET | Freq: Two times a day (BID) | ORAL | 0 refills | Status: DC

## 2016-03-25 NOTE — Telephone Encounter (Signed)
Rx to [facility].  Tried to call pt, no answer and voice mail full.

## 2016-03-25 NOTE — Telephone Encounter (Signed)
Still sick from last week.  Has started having greenish mucous discharge again and is feeling bad.  Please advise?

## 2016-03-25 NOTE — Telephone Encounter (Signed)
Prescribed azithromycin at OV.  Allergy to penicillin. Sulfa allergy. Will treat with doxycycline 100 mg 1 by mouth twice a day 10 days #20+0.

## 2016-03-26 ENCOUNTER — Other Ambulatory Visit: Payer: Self-pay

## 2016-03-26 DIAGNOSIS — J988 Other specified respiratory disorders: Secondary | ICD-10-CM

## 2016-03-26 DIAGNOSIS — B9689 Other specified bacterial agents as the cause of diseases classified elsewhere: Secondary | ICD-10-CM

## 2016-03-26 MED ORDER — HYDROCOD POLST-CPM POLST ER 10-8 MG/5ML PO SUER: 5.0000 mL | Freq: Two times a day (BID) | ORAL | 0 refills | Status: DC | PRN

## 2016-03-26 NOTE — Telephone Encounter (Signed)
Pt states she is out of Tussionex and is req a refill  Last OV 12-27 Ok to refill?

## 2016-03-26 NOTE — Telephone Encounter (Signed)
Refill approved. This does have to be printed as it contains hydrocodone.

## 2016-03-26 NOTE — Telephone Encounter (Signed)
Pt boyfriend called and stated Ms. Piedra called him and stated she had not heard from anyone at the office.  I was not able to give info to the boyfriend other than I had talked with the pt and that she was aware of what was going on and that if she needed further assistance than she needed to call the office directly.  Pt boyfriend asked if I could call Ms. Rinella I  explained he could have her call me my extension was given.

## 2016-03-26 NOTE — Telephone Encounter (Signed)
Rx called in spoke with pt she is aware. Explained she may want to call [facility] to confirm they have it before going to p/u

## 2016-03-26 NOTE — Telephone Encounter (Signed)
Pt aware of prescription 

## 2016-03-27 ENCOUNTER — Other Ambulatory Visit: Payer: Self-pay

## 2016-03-27 DIAGNOSIS — B9689 Other specified bacterial agents as the cause of diseases classified elsewhere: Secondary | ICD-10-CM

## 2016-03-27 DIAGNOSIS — J988 Other specified respiratory disorders: Secondary | ICD-10-CM

## 2016-03-27 MED ORDER — HYDROCOD POLST-CPM POLST ER 10-8 MG/5ML PO SUER: 5.0000 mL | Freq: Two times a day (BID) | ORAL | 0 refills | Status: DC | PRN

## 2016-03-27 NOTE — Telephone Encounter (Signed)
rx for chlorpheniramine-Hydrocodone was called in and a hard copy should have been picked up.  Called pt made aware she states she will p/u

## 2016-03-30 ENCOUNTER — Telehealth: Payer: Self-pay | Admitting: Family Medicine

## 2016-03-30 NOTE — Telephone Encounter (Signed)
Tried calling pt no ans VM is full, She has had two antibiotics called in if she is still sick she need to make follow up Ov

## 2016-03-30 NOTE — Telephone Encounter (Signed)
Patient calling back wanting a zpak she is tired of being sick  [phone]

## 2016-04-06 ENCOUNTER — Ambulatory Visit: Payer: Self-pay | Admitting: Family Medicine

## 2016-04-17 ENCOUNTER — Other Ambulatory Visit: Payer: Self-pay | Admitting: Gynecology

## 2016-04-17 ENCOUNTER — Ambulatory Visit (INDEPENDENT_AMBULATORY_CARE_PROVIDER_SITE_OTHER): Payer: BLUE CROSS/BLUE SHIELD | Admitting: Gynecology

## 2016-04-17 ENCOUNTER — Encounter: Payer: Self-pay | Admitting: Gynecology

## 2016-04-17 VITALS — BP 120/76

## 2016-04-17 DIAGNOSIS — L0292 Furuncle, unspecified: Secondary | ICD-10-CM | POA: Diagnosis not present

## 2016-04-17 DIAGNOSIS — J321 Chronic frontal sinusitis: Secondary | ICD-10-CM | POA: Diagnosis not present

## 2016-04-17 DIAGNOSIS — N3000 Acute cystitis without hematuria: Secondary | ICD-10-CM | POA: Diagnosis not present

## 2016-04-17 LAB — URINALYSIS W MICROSCOPIC + REFLEX CULTURE
Bilirubin Urine: NEGATIVE
CRYSTALS: NONE SEEN [HPF]
Casts: NONE SEEN [LPF]
GLUCOSE, UA: NEGATIVE
HGB URINE DIPSTICK: NEGATIVE
KETONES UR: NEGATIVE
Nitrite: NEGATIVE
PH: 5.5 (ref 5.0–8.0)
PROTEIN: NEGATIVE
Specific Gravity, Urine: 1.025 (ref 1.001–1.035)
YEAST: NONE SEEN [HPF]

## 2016-04-17 MED ORDER — AMOXICILLIN-POT CLAVULANATE 875-125 MG PO TABS: 1.0000 | ORAL_TABLET | Freq: Two times a day (BID) | ORAL | 0 refills | Status: DC

## 2016-04-17 NOTE — Patient Instructions (Signed)
Take the antibiotic twice daily for 7 days.  Follow-up if your symptoms persist, worsen or recur. 

## 2016-04-17 NOTE — Progress Notes (Signed)
    Catherine Rodriguez [birth year] [id number]        46 y.o.  G2P0020 presents with 3 issues:  1. Increasing urinary frequency, dysuria, suprapubic pain. Also some low back pain. No fever or chills. No nausea vomiting diarrhea constipation. Had been treated for UTI in October and then subsequently again in December. States that she never quite felt that it cleared and now the symptoms seem worse. Was treated with ciprofloxacin by her primary physician in December. 2. Recurrent sinusitis. Patient was also treated for sinusitis by her primary physician by her history. She persists with periorbital pressure and discomfort and some drainage. No fever or chills. 3. Bilateral buttocks boils onset over the last several weeks. Uncomfortable to her.  Past medical history,surgical history, problem list, medications, allergies, family history and social history were all reviewed and documented in the EPIC chart.  Directed ROS with pertinent positives and negatives documented in the history of present illness/assessment and plan.  Exam: Caryn Bee assistant Vitals:   04/17/16 0936  BP: 120/76   General appearance:  Normal HEENT with some periorbital tenderness. No evidence of cellulitis. No pharyngitis or cervical adenopathy Abdomen soft nontender without masses guarding rebound Pelvic external BUS vagina normal. Cervix normal. Uterus normal size midline mobile nontender. Adnexa without masses or tenderness. Buttocks with several scattered small nondraining boils on both sides.  Assessment/Plan:  46 y.o. G2P0020 with:  1. History consistent with UTI. Urinalysis also consistent with UTI with 10-20 WBC and moderate bacteria. Will cover more aggressively with Augmentin 875/125 twice a day 7 days. Will follow up if symptoms persist, worsen or recur. 2. Sinusitis symptoms. Will cover with Augmentin as above. Follow up with primary physician if continues. 3. Bilateral buttocks boils. Will cover with  Augmentin as above. Will follow up if continues to be an issue.    Anastasio Auerbach MD, 10:12 AM 04/17/2016

## 2016-04-20 LAB — URINE CULTURE

## 2016-05-05 ENCOUNTER — Telehealth: Payer: Self-pay | Admitting: *Deleted

## 2016-05-05 MED ORDER — CIPROFLOXACIN HCL 250 MG PO TABS: 250.0000 mg | ORAL_TABLET | Freq: Two times a day (BID) | ORAL | 0 refills | Status: DC

## 2016-05-05 NOTE — Telephone Encounter (Signed)
Pt informed Rx sent. 

## 2016-05-05 NOTE — Addendum Note (Signed)
Addended by: Thamas Jaegers on: 05/05/2016 10:24 AM   Modules accepted: Orders

## 2016-05-05 NOTE — Telephone Encounter (Signed)
Pt was treated for UTI on 04/17/16 with Augmentin 875/125 twice a day 7 days completed, states she still has pressure and burning with urination and dull feeling, states once again that the UTI never fully cleared from previous time. Asked if you could send another Rx to pharmacy?  Please advise

## 2016-05-05 NOTE — Telephone Encounter (Signed)
Recommend ciprofloxacin 250 mg twice a day 7 days

## 2016-05-06 ENCOUNTER — Telehealth: Payer: Self-pay | Admitting: *Deleted

## 2016-05-06 MED ORDER — PHENAZOPYRIDINE HCL 200 MG PO TABS: 200.0000 mg | ORAL_TABLET | Freq: Three times a day (TID) | ORAL | 0 refills | Status: DC | PRN

## 2016-05-06 MED ORDER — SULFAMETHOXAZOLE-TRIMETHOPRIM 800-160 MG PO TABS: 1.0000 | ORAL_TABLET | Freq: Two times a day (BID) | ORAL | 0 refills | Status: DC

## 2016-05-06 NOTE — Telephone Encounter (Signed)
Pt was prescribed Cipro 250 mg twice daily x7day yesterday. Pt called back today stating she remembered that Cipro doesn't help with her UTI she taken several times in past and no relief. She took one dose yesterday and one dose this am, states she now has lower back discomfort, and bladder spasms. Pt asked if cipro could be switched to something different? And something for the spasms, states AZO doesn't help. Please advise

## 2016-05-06 NOTE — Telephone Encounter (Signed)
Okay for Septra DS 1 by mouth twice a day times 7 days. she lists this as a allergy but nausea  is not a true allergy. she limits our choices because she lists nausea for all the antibiotics. also Pyridium 200 mg 3 times a day #15 okay for spasms.

## 2016-05-06 NOTE — Telephone Encounter (Signed)
Pt informed, both Rx sent.

## 2016-05-06 NOTE — Addendum Note (Signed)
Addended by: Thamas Jaegers on: 05/06/2016 11:46 AM   Modules accepted: Orders

## 2016-05-20 ENCOUNTER — Encounter: Payer: Self-pay | Admitting: Gynecology

## 2016-05-26 ENCOUNTER — Encounter: Payer: Self-pay | Admitting: Gynecology

## 2016-08-14 ENCOUNTER — Encounter: Payer: Self-pay | Admitting: Gastroenterology

## 2016-09-24 ENCOUNTER — Other Ambulatory Visit: Payer: Self-pay | Admitting: Physician Assistant

## 2016-10-01 ENCOUNTER — Telehealth: Payer: Self-pay | Admitting: Family Medicine

## 2016-10-01 NOTE — Telephone Encounter (Signed)
Pt calling needs refill on xanax

## 2016-10-01 NOTE — Telephone Encounter (Signed)
Ok to refill??      LRF 06/02/15

## 2016-10-02 MED ORDER — ALPRAZOLAM 0.5 MG PO TABS: 0.5000 mg | ORAL_TABLET | Freq: Two times a day (BID) | ORAL | 0 refills | Status: DC

## 2016-10-02 NOTE — Telephone Encounter (Signed)
Med called to [facility]

## 2016-10-02 NOTE — Telephone Encounter (Signed)
Received call from [facility] stating that pt received a rx for diazepam from Dr. Toy Care on 7/5. Per [facility] can not take both together - [facility] called and made aware to cxd rx.

## 2016-10-02 NOTE — Telephone Encounter (Addendum)
Spoke to pt and made aware that she can not use diazepam and xanax together. She states that she has started having panic attacks again and the diazepam makes her really sleepy. Informed pt to call and discuss with Dr. Toy Care.

## 2016-10-02 NOTE — Telephone Encounter (Signed)
ok 

## 2016-10-02 NOTE — Addendum Note (Signed)
Addended by: Shary Decamp B on: 10/02/2016 02:39 PM   Modules accepted: Orders

## 2017-01-18 ENCOUNTER — Encounter: Payer: Self-pay | Admitting: Gynecology

## 2017-01-18 ENCOUNTER — Ambulatory Visit (INDEPENDENT_AMBULATORY_CARE_PROVIDER_SITE_OTHER): Payer: BLUE CROSS/BLUE SHIELD | Admitting: Gynecology

## 2017-01-18 VITALS — BP 118/76 | Ht 65.0 in | Wt 227.0 lb

## 2017-01-18 DIAGNOSIS — N920 Excessive and frequent menstruation with regular cycle: Secondary | ICD-10-CM | POA: Diagnosis not present

## 2017-01-18 DIAGNOSIS — N946 Dysmenorrhea, unspecified: Secondary | ICD-10-CM | POA: Diagnosis not present

## 2017-01-18 DIAGNOSIS — Z01419 Encounter for gynecological examination (general) (routine) without abnormal findings: Secondary | ICD-10-CM | POA: Diagnosis not present

## 2017-01-18 DIAGNOSIS — A609 Anogenital herpesviral infection, unspecified: Secondary | ICD-10-CM | POA: Diagnosis not present

## 2017-01-18 DIAGNOSIS — Z23 Encounter for immunization: Secondary | ICD-10-CM | POA: Diagnosis not present

## 2017-01-18 LAB — CBC WITH DIFFERENTIAL/PLATELET
BASOS PCT: 0.7 %
Basophils Absolute: 41 cells/uL (ref 0–200)
EOS ABS: 99 {cells}/uL (ref 15–500)
Eosinophils Relative: 1.7 %
HEMATOCRIT: 39.4 % (ref 35.0–45.0)
HEMOGLOBIN: 13.7 g/dL (ref 11.7–15.5)
LYMPHS ABS: 1224 {cells}/uL (ref 850–3900)
MCH: 30.8 pg (ref 27.0–33.0)
MCHC: 34.8 g/dL (ref 32.0–36.0)
MCV: 88.5 fL (ref 80.0–100.0)
MPV: 10.6 fL (ref 7.5–12.5)
Monocytes Relative: 9.5 %
NEUTROS ABS: 3886 {cells}/uL (ref 1500–7800)
Neutrophils Relative %: 67 %
Platelets: 301 10*3/uL (ref 140–400)
RBC: 4.45 10*6/uL (ref 3.80–5.10)
RDW: 12.7 % (ref 11.0–15.0)
TOTAL LYMPHOCYTE: 21.1 %
WBC: 5.8 10*3/uL (ref 3.8–10.8)
WBCMIX: 551 {cells}/uL (ref 200–950)

## 2017-01-18 LAB — TSH: TSH: 1.21 m[IU]/L

## 2017-01-18 LAB — COMPREHENSIVE METABOLIC PANEL
AG RATIO: 1.5 (calc) (ref 1.0–2.5)
ALBUMIN MSPROF: 4 g/dL (ref 3.6–5.1)
ALKALINE PHOSPHATASE (APISO): 79 U/L (ref 33–115)
ALT: 13 U/L (ref 6–29)
AST: 18 U/L (ref 10–35)
BILIRUBIN TOTAL: 0.3 mg/dL (ref 0.2–1.2)
BUN: 16 mg/dL (ref 7–25)
CHLORIDE: 99 mmol/L (ref 98–110)
CO2: 29 mmol/L (ref 20–32)
Calcium: 9.5 mg/dL (ref 8.6–10.2)
Creat: 0.89 mg/dL (ref 0.50–1.10)
GLOBULIN: 2.6 g/dL (ref 1.9–3.7)
Glucose, Bld: 80 mg/dL (ref 65–99)
POTASSIUM: 4.2 mmol/L (ref 3.5–5.3)
Sodium: 137 mmol/L (ref 135–146)
Total Protein: 6.6 g/dL (ref 6.1–8.1)

## 2017-01-18 MED ORDER — IBUPROFEN 800 MG PO TABS
800.0000 mg | ORAL_TABLET | Freq: Three times a day (TID) | ORAL | 4 refills | Status: DC | PRN
Start: 2017-01-18 — End: 2018-01-22

## 2017-01-18 MED ORDER — VALACYCLOVIR HCL 500 MG PO TABS: 500.0000 mg | ORAL_TABLET | Freq: Two times a day (BID) | ORAL | 6 refills | Status: DC

## 2017-01-18 NOTE — Progress Notes (Signed)
    Catherine Rodriguez [birth year] [id number]        46 y.o.  G2P0020 for annual gynecologic exam.  Patient notes that her menses are heavy with fairly heavy cramping.  Has been using ibuprofen 800 mg but does not seem to be working as well as it has in the past.  Having menses monthly with no intermenstrual bleeding.  Currently not sexually active.  Past medical history,surgical history, problem list, medications, allergies, family history and social history were all reviewed and documented as reviewed in the EPIC chart.  ROS:  Performed with pertinent positives and negatives included in the history, assessment and plan.   Additional significant findings : None   Exam: Caryn Bee assistant Vitals:   01/18/17 1419  BP: 118/76  Weight: 227 lb (103 kg)  Height: 5\' 5"  (1.651 m)   Body mass index is 37.77 kg/m.  General appearance:  Normal affect, orientation and appearance. Skin: Grossly normal HEENT: Without gross lesions.  No cervical or supraclavicular adenopathy. Thyroid normal.  Lungs:  Clear without wheezing, rales or rhonchi Cardiac: RR, without RMG Abdominal:  Soft, nontender, without masses, guarding, rebound, organomegaly or hernia Breasts:  Examined lying and sitting without masses, retractions, discharge or axillary adenopathy. Pelvic:  Ext, BUS, Vagina: Normal  Cervix: Normal  Uterus: Anteverted, normal size, shape and contour, midline and mobile nontender   Adnexa: Without masses or tenderness    Anus and perineum: Normal   Rectovaginal: Normal sphincter tone without palpated masses or tenderness.    Assessment/Plan:  46 y.o. G24P0020 female for annual gynecologic exam with regular menses, absent in contraception.   1. Dysmenorrhea/menorrhagia.  Had been using 800 Motrin which helped but now seems not to be working as well.  Exam is normal.  Recommend sonohysterogram to rule out nonpalpable abnormalities.  Baseline CBC along with TSH.  Options for management reviewed  to include hormonal manipulation such as low-dose oral contraceptives, endometrial ablation, Mirena IUD.  Patient is going to follow-up for the sonohysterogram and then we will further discuss.  I did refill her ibuprofen 800 mg #90 with 4 refills to have. 2. HSV.  Having 1 or 2 outbreaks per year using Valtrex intermittently.  Valtrex 500 mg twice daily times 3-5 days at earliest onset of outbreak #30 with refills provided. 3. Pap smear/HPV 2016.  No Pap smear done today.  History of ASCUS with positive HPV 2013.  Follow-up Pap smears negative x2 with negative HPV.  Plan repeat Pap smear approaching 5-year interval per current screening guidelines. 4. Mammography 05/2016.  Continue with annual mammography when due.  Breast exam normal today. 5. Colonoscopy due now on patient knows to call and schedule.  She is at 5 years with polyps previously. 6. Health maintenance.  Baseline CBC, CMP, TSH ordered.  Lipid profile last year good.  Follow-up for sonohysterogram.  Follow-up in 1 year for annual exam.   Anastasio Auerbach MD, 2:54 PM 01/18/2017

## 2017-01-18 NOTE — Patient Instructions (Signed)
Follow up for ultrasound as scheduled 

## 2017-01-22 ENCOUNTER — Other Ambulatory Visit: Payer: Self-pay | Admitting: Gynecology

## 2017-01-22 ENCOUNTER — Telehealth: Payer: Self-pay | Admitting: *Deleted

## 2017-01-22 MED ORDER — CIPROFLOXACIN HCL 250 MG PO TABS: 250.0000 mg | ORAL_TABLET | Freq: Two times a day (BID) | ORAL | 0 refills | Status: DC

## 2017-01-22 MED ORDER — PHENAZOPYRIDINE HCL 200 MG PO TABS: 200.0000 mg | ORAL_TABLET | Freq: Three times a day (TID) | ORAL | 0 refills | Status: DC | PRN

## 2017-01-22 NOTE — Telephone Encounter (Signed)
Ciprofloxacin 250 mg twice daily times 5 days.  Pyridium 200 mg 3 times daily times 3 days.

## 2017-01-22 NOTE — Telephone Encounter (Signed)
Tried to relay to patient but voicemail was full, Rx sent for Cipro

## 2017-01-22 NOTE — Telephone Encounter (Signed)
The reason I picked ciprofloxacin is because her chart lists sulfa antibiotics as an intolerance and Septra is 1 of those antibiotics.  From a UTI standpoint ciprofloxacin actually has better coverage than Septra usually

## 2017-01-22 NOTE — Telephone Encounter (Signed)
Patient also asked if Azo can be prescribed as well to help with discomfort.

## 2017-01-22 NOTE — Telephone Encounter (Signed)
Pt informed, asked if Septra DS could be sent, states Cipro doesn't help any with UTI.

## 2017-01-22 NOTE — Telephone Encounter (Signed)
Pt was seen for annual exam on 01/18/17, thought urine checked, pt states she has painful burning with urination "full blown UTI" per patient. Pt asked since she was just here, can Rx be sent to pharmacy?

## 2017-01-25 ENCOUNTER — Telehealth: Payer: Self-pay | Admitting: *Deleted

## 2017-01-25 MED ORDER — SULFAMETHOXAZOLE-TRIMETHOPRIM 800-160 MG PO TABS: 1.0000 | ORAL_TABLET | Freq: Two times a day (BID) | ORAL | 0 refills | Status: DC

## 2017-01-25 NOTE — Telephone Encounter (Signed)
Okay for Septra DS 1 p.o. twice daily times 3 days

## 2017-01-25 NOTE — Telephone Encounter (Signed)
Pt aware, Rx sent. 

## 2017-01-25 NOTE — Telephone Encounter (Signed)
Pt called to follow up with 01/22/17 telephone encounter stating Cipro is not working still having symptoms. Pt said we can remove septra from allergy list states this sulfa Rx doesn't make her "sick". Please advise

## 2017-02-02 ENCOUNTER — Ambulatory Visit: Payer: BLUE CROSS/BLUE SHIELD | Admitting: Women's Health

## 2017-02-02 ENCOUNTER — Encounter: Payer: Self-pay | Admitting: Women's Health

## 2017-02-02 VITALS — BP 120/80 | Ht 65.0 in | Wt 227.0 lb

## 2017-02-02 DIAGNOSIS — R350 Frequency of micturition: Secondary | ICD-10-CM

## 2017-02-02 DIAGNOSIS — N3000 Acute cystitis without hematuria: Secondary | ICD-10-CM

## 2017-02-02 DIAGNOSIS — Z113 Encounter for screening for infections with a predominantly sexual mode of transmission: Secondary | ICD-10-CM | POA: Diagnosis not present

## 2017-02-02 DIAGNOSIS — N898 Other specified noninflammatory disorders of vagina: Secondary | ICD-10-CM

## 2017-02-02 LAB — WET PREP FOR TRICH, YEAST, CLUE

## 2017-02-02 MED ORDER — NITROFURANTOIN MONOHYD MACRO 100 MG PO CAPS: 100.0000 mg | ORAL_CAPSULE | Freq: Two times a day (BID) | ORAL | 0 refills | Status: AC

## 2017-02-02 NOTE — Patient Instructions (Signed)

## 2017-02-02 NOTE — Progress Notes (Signed)
46 year old 6S WF G20020 with complaint of increased urinary frequency, pain at end of stream of urination, pulsating slight/achy type discomfort in lower abdomen for the past month. Denies vaginal discharge, fever. Regular monthly cycle. Not sexually active, last partner for 20 years unfaithful. Was treated with Septra one week ago without relief of symptoms.  Exam: Appears well, no CVAT. Abdomen soft without rebound or radiation, slight discomfort with deep palpation at suprapubic area. External genitalia within normal limits, speculum exam scant discharge no visible erythema or odor noted, wet prep negative. GC/Chlamydia culture taken. Bimanual no CMT or adnexal tenderness. Positive nitrites, +2 leukocytes, 20-40 WBCs, no RBCs, many bacteria, 0-5 squamous epithelials  UTI STD screen  Plan: Urine culture pending. Macrobid twice daily for 7 days instructed to take with food, states has had some nausea with it in the past but states has never had good relief of UTIs with Cipro. Instructed to call if nausea and vomiting and to stop medication. UTI prevention discussed. GC/Chlamydia culture pending, HIV, hep B, C, RPR.

## 2017-02-03 LAB — C. TRACHOMATIS/N. GONORRHOEAE RNA
C. trachomatis RNA, TMA: NOT DETECTED
N. gonorrhoeae RNA, TMA: NOT DETECTED

## 2017-02-03 LAB — HEPATITIS C ANTIBODY
HEP C AB: NONREACTIVE
SIGNAL TO CUT-OFF: 0.01 (ref <1.00)

## 2017-02-03 LAB — RPR: RPR: NONREACTIVE

## 2017-02-03 LAB — HEPATITIS B SURFACE ANTIGEN: HEP B S AG: NONREACTIVE

## 2017-02-03 LAB — HIV ANTIBODY (ROUTINE TESTING W REFLEX): HIV: NONREACTIVE

## 2017-02-05 LAB — URINALYSIS W MICROSCOPIC + REFLEX CULTURE
BILIRUBIN URINE: NEGATIVE
GLUCOSE, UA: NEGATIVE
HYALINE CAST: NONE SEEN /LPF
KETONES UR: NEGATIVE
Nitrites, Initial: POSITIVE — AB
RBC / HPF: NONE SEEN /HPF (ref 0–2)
Specific Gravity, Urine: 1.025 (ref 1.001–1.03)
pH: 7 (ref 5.0–8.0)

## 2017-02-05 LAB — URINE CULTURE
MICRO NUMBER: 81279221
SPECIMEN QUALITY: ADEQUATE

## 2017-02-05 LAB — CULTURE INDICATED

## 2017-02-23 ENCOUNTER — Other Ambulatory Visit: Payer: Self-pay | Admitting: Women's Health

## 2017-02-23 ENCOUNTER — Telehealth: Payer: Self-pay

## 2017-02-23 MED ORDER — NITROFURANTOIN MONOHYD MACRO 100 MG PO CAPS: 100.0000 mg | ORAL_CAPSULE | Freq: Two times a day (BID) | ORAL | 0 refills | Status: DC

## 2017-02-23 NOTE — Telephone Encounter (Signed)
Patient saw you in office on 02/02/17. She was treated for UTI with Macrobid and took it x 7days.  She said her symptoms have returned. She has appointment schedule for tomorrow at 2:00pm to see you but wondered if she just needed another round of the [place]?

## 2017-02-23 NOTE — Telephone Encounter (Signed)
Patient complained of urgency and dysuria.  She said no fever, abd pain or discharge.  Rx sent. Appointment cancelled.

## 2017-02-23 NOTE — Telephone Encounter (Signed)
Please call, yes do another round of Macrobid, culture had numerous medications resistant . If she has any discharge, abdominal pain or fever she needs to keep appointment.

## 2017-02-24 ENCOUNTER — Ambulatory Visit: Payer: BLUE CROSS/BLUE SHIELD | Admitting: Women's Health

## 2017-03-29 ENCOUNTER — Ambulatory Visit: Payer: BLUE CROSS/BLUE SHIELD | Admitting: Family Medicine

## 2017-03-29 ENCOUNTER — Encounter: Payer: Self-pay | Admitting: Family Medicine

## 2017-03-29 VITALS — BP 130/78 | HR 80 | Temp 98.5°F | Resp 18 | Ht 66.0 in | Wt 225.0 lb

## 2017-03-29 DIAGNOSIS — B9689 Other specified bacterial agents as the cause of diseases classified elsewhere: Secondary | ICD-10-CM

## 2017-03-29 DIAGNOSIS — J019 Acute sinusitis, unspecified: Secondary | ICD-10-CM | POA: Diagnosis not present

## 2017-03-29 DIAGNOSIS — L249 Irritant contact dermatitis, unspecified cause: Secondary | ICD-10-CM | POA: Diagnosis not present

## 2017-03-29 MED ORDER — PREDNISONE 20 MG PO TABS: ORAL_TABLET | ORAL | 0 refills | Status: DC

## 2017-03-29 MED ORDER — TRIAMCINOLONE ACETONIDE 0.1 % EX CREA: 1.0000 "application " | TOPICAL_CREAM | Freq: Two times a day (BID) | CUTANEOUS | 0 refills | Status: DC

## 2017-03-29 MED ORDER — AMOXICILLIN-POT CLAVULANATE 875-125 MG PO TABS: 1.0000 | ORAL_TABLET | Freq: Two times a day (BID) | ORAL | 0 refills | Status: DC

## 2017-03-29 NOTE — Progress Notes (Signed)
Subjective:    Patient ID: Catherine Rodriguez, female    DOB: 11-24-1970, 47 y.o.   MRN: [id number]  HPI Patient reports 4 weeks of pain and pressure in her sinuses.  Started as a cold before Thanksgiving.  Progressed to right>left maxillary sinus pain, post nasal drip, worsening congestion, headaches, subjective fevers and constant cough with wheezing.   Past Medical History:  Diagnosis Date  . ASCUS with positive high risk HPV cervical 2013   normal colposcopy  . GERD (gastroesophageal reflux disease)   . Herpes simplex   . IBS (irritable bowel syndrome)    Past Surgical History:  Procedure Laterality Date  . APPENDECTOMY  1993  . eyellid  2010  . THERAPEUTIC ABORTION     X 2  . tubes in ears     as a child   Current Outpatient Medications on File Prior to Visit  Medication Sig Dispense Refill  . ALPRAZolam (XANAX) 0.5 MG tablet   1  . amphetamine-dextroamphetamine (ADDERALL, 30MG ,) 30 MG tablet Take 30 mg by mouth 2 (two) times daily.      . eszopiclone (LUNESTA) 1 MG TABS tablet Take 1 mg by mouth at bedtime as needed for sleep. Take immediately before bedtime    . ibuprofen (ADVIL,MOTRIN) 800 MG tablet Take 1 tablet (800 mg total) by mouth every 8 (eight) hours as needed. 90 tablet 4  . valACYclovir (VALTREX) 500 MG tablet Take 1 tablet (500 mg total) by mouth 2 (two) times daily. At onset of outbreak for 5 days 30 tablet 6   No current facility-administered medications on file prior to visit.    Allergies  Allergen Reactions  . Doxycycline Nausea And Vomiting  . Macrobid [Nitrofurantoin Macrocrystal] Nausea And Vomiting  . Penicillins     Got real sick   Social History   Socioeconomic History  . Marital status: Single    Spouse name: Not on file  . Number of children: Not on file  . Years of education: Not on file  . Highest education level: Not on file  Social Needs  . Financial resource strain: Not on file  . Food insecurity - worry: Not on file  . Food  insecurity - inability: Not on file  . Transportation needs - medical: Not on file  . Transportation needs - non-medical: Not on file  Occupational History  . Not on file  Tobacco Use  . Smoking status: Never Smoker  . Smokeless tobacco: Never Used  Substance and Sexual Activity  . Alcohol use: Yes    Alcohol/week: 0.0 oz    Comment: Occas wine  . Drug use: No  . Sexual activity: Not Currently    Comment: 1st intercourse 47 yo-Fewer than 5 partners  Other Topics Concern  . Not on file  Social History Narrative  . Not on file     Review of Systems  All other systems reviewed and are negative.      Objective:   Physical Exam  Constitutional: She appears well-developed and well-nourished. No distress.  HENT:  Right Ear: Tympanic membrane, external ear and ear canal normal.  Left Ear: Tympanic membrane, external ear and ear canal normal.  Nose: Mucosal edema and rhinorrhea present. Right sinus exhibits maxillary sinus tenderness. Right sinus exhibits no frontal sinus tenderness. Left sinus exhibits maxillary sinus tenderness. Left sinus exhibits no frontal sinus tenderness.  Mouth/Throat: Oropharynx is clear and moist. No oropharyngeal exudate, posterior oropharyngeal edema or posterior oropharyngeal erythema.  Neck: Neck supple.  Cardiovascular: Normal rate, regular rhythm and normal heart sounds.  No murmur heard. Pulmonary/Chest: Effort normal and breath sounds normal. No respiratory distress. She has no wheezes. She has no rales. She exhibits no tenderness.  Lymphadenopathy:    She has no cervical adenopathy.  Skin: She is not diaphoretic.  Vitals reviewed.   Irritant rash with red macules and papules (2-4 mm in size in right axilla) from shaving        Assessment & Plan:   patient has secondary bacterial rhinosinusiits. Begin augmentin 875 mg pobid for 10 days and use triamcinolone cream bid for 1 week for irritant dermatitis.  Originally recommended prednisone  taper pack for sinuses but patient declined.

## 2017-10-01 ENCOUNTER — Telehealth: Payer: Self-pay | Admitting: *Deleted

## 2017-10-01 NOTE — Telephone Encounter (Signed)
Faxed Denial for Mometasone Cream, Patient needs an appointment for refills.

## 2017-11-11 ENCOUNTER — Encounter: Payer: Self-pay | Admitting: Gynecology

## 2018-01-19 ENCOUNTER — Ambulatory Visit: Payer: BLUE CROSS/BLUE SHIELD | Admitting: Women's Health

## 2018-01-19 ENCOUNTER — Encounter: Payer: Self-pay | Admitting: Women's Health

## 2018-01-19 VITALS — BP 118/80 | Ht 66.0 in | Wt 229.0 lb

## 2018-01-19 DIAGNOSIS — Z23 Encounter for immunization: Secondary | ICD-10-CM | POA: Diagnosis not present

## 2018-01-19 DIAGNOSIS — Z01419 Encounter for gynecological examination (general) (routine) without abnormal findings: Secondary | ICD-10-CM

## 2018-01-19 MED ORDER — VALACYCLOVIR HCL 500 MG PO TABS: 500.0000 mg | ORAL_TABLET | Freq: Two times a day (BID) | ORAL | 6 refills | Status: DC

## 2018-01-19 NOTE — Addendum Note (Signed)
Addended by: Lorine Bears on: 01/19/2018 12:57 PM   Modules accepted: Orders
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
## 2018-01-19 NOTE — Patient Instructions (Signed)

## 2018-01-21 LAB — PAP, TP IMAGING W/ HPV RNA, RFLX HPV TYPE 16,18/45: HPV DNA HIGH RISK: NOT DETECTED

## 2018-01-22 ENCOUNTER — Other Ambulatory Visit: Payer: Self-pay | Admitting: Gynecology

## 2018-01-24 NOTE — Telephone Encounter (Signed)
Last filled in 12/2016

## 2018-05-27 DIAGNOSIS — M19041 Primary osteoarthritis, right hand: Secondary | ICD-10-CM | POA: Insufficient documentation

## 2018-10-17 ENCOUNTER — Encounter: Payer: Self-pay | Admitting: Gynecology

## 2018-10-25 DIAGNOSIS — H9202 Otalgia, left ear: Secondary | ICD-10-CM | POA: Insufficient documentation

## 2018-11-03 ENCOUNTER — Telehealth: Payer: Self-pay | Admitting: Family Medicine

## 2018-11-03 ENCOUNTER — Other Ambulatory Visit: Payer: Self-pay | Admitting: Family Medicine

## 2018-11-03 MED ORDER — PROMETHAZINE HCL 25 MG PO TABS: 25.0000 mg | ORAL_TABLET | Freq: Three times a day (TID) | ORAL | 0 refills | Status: DC | PRN

## 2018-11-03 NOTE — Telephone Encounter (Signed)
Ok, ntbs if no better

## 2018-11-03 NOTE — Telephone Encounter (Signed)
Pt called wanting a refill on Phenergan for some nausea she is having.   OK to refill?   LOV: 03/29/17  LRF:  06/03/15

## 2018-11-04 NOTE — Telephone Encounter (Signed)
Tried to call no answer and no vm 

## 2018-11-07 NOTE — Telephone Encounter (Signed)
Pt aware.

## 2018-12-14 ENCOUNTER — Encounter: Payer: Self-pay | Admitting: Gynecology

## 2019-01-23 ENCOUNTER — Encounter: Payer: Self-pay | Admitting: Women's Health

## 2019-01-23 ENCOUNTER — Ambulatory Visit: Payer: BC Managed Care – PPO | Admitting: Women's Health

## 2019-01-23 ENCOUNTER — Other Ambulatory Visit: Payer: Self-pay

## 2019-01-23 VITALS — BP 126/80 | Ht 66.0 in | Wt 233.0 lb

## 2019-01-23 DIAGNOSIS — Z13220 Encounter for screening for lipoid disorders: Secondary | ICD-10-CM

## 2019-01-23 DIAGNOSIS — Z01419 Encounter for gynecological examination (general) (routine) without abnormal findings: Secondary | ICD-10-CM | POA: Diagnosis not present

## 2019-01-23 DIAGNOSIS — Z23 Encounter for immunization: Secondary | ICD-10-CM

## 2019-01-23 MED ORDER — VALACYCLOVIR HCL 500 MG PO TABS: 500.0000 mg | ORAL_TABLET | Freq: Two times a day (BID) | ORAL | 6 refills | Status: DC

## 2019-01-23 MED ORDER — PROMETHAZINE HCL 25 MG PO TABS: 25.0000 mg | ORAL_TABLET | Freq: Three times a day (TID) | ORAL | 0 refills | Status: DC | PRN

## 2019-01-23 MED ORDER — IBUPROFEN 800 MG PO TABS: 800.0000 mg | ORAL_TABLET | Freq: Three times a day (TID) | ORAL | 4 refills | Status: DC | PRN

## 2019-01-23 NOTE — Patient Instructions (Addendum)
If cycles are closer than 21 days from day 1 to day 1 of next cycle  CALL and will proceed to sononhysterogram.     Health Maintenance, Female Adopting a healthy lifestyle and getting preventive care are important in promoting health and wellness. Ask your health care provider about:  The right schedule for you to have regular tests and exams.  Things you can do on your own to prevent diseases and keep yourself healthy. What should I know about diet, weight, and exercise? Eat a healthy diet   Eat a diet that includes plenty of vegetables, fruits, low-fat dairy products, and lean protein.  Do not eat a lot of foods that are high in solid fats, added sugars, or sodium. Maintain a healthy weight Body mass index (BMI) is used to identify weight problems. It estimates body fat based on height and weight. Your health care provider can help determine your BMI and help you achieve or maintain a healthy weight. Get regular exercise Get regular exercise. This is one of the most important things you can do for your health. Most adults should:  Exercise for at least 150 minutes each week. The exercise should increase your heart rate and make you sweat (moderate-intensity exercise).  Do strengthening exercises at least twice a week. This is in addition to the moderate-intensity exercise.  Spend less time sitting. Even light physical activity can be beneficial. Watch cholesterol and blood lipids Have your blood tested for lipids and cholesterol at 50 years of age, then have this test every 5 years. Have your cholesterol levels checked more often if:  Your lipid or cholesterol levels are high.  You are older than 50 years of age.  You are at high risk for heart disease. What should I know about cancer screening? Depending on your health history and family history, you may need to have cancer screening at various ages. This may include screening for:  Breast cancer.  Cervical  cancer.  Colorectal cancer.  Skin cancer.  Lung cancer. What should I know about heart disease, diabetes, and high blood pressure? Blood pressure and heart disease  High blood pressure causes heart disease and increases the risk of stroke. This is more likely to develop in people who have high blood pressure readings, are of African descent, or are overweight.  Have your blood pressure checked: ? Every 3-5 years if you are 35-4 years of age. ? Every year if you are 90 years old or older. Diabetes Have regular diabetes screenings. This checks your fasting blood sugar level. Have the screening done:  Once every three years after age 29 if you are at a normal weight and have a low risk for diabetes.  More often and at a younger age if you are overweight or have a high risk for diabetes. What should I know about preventing infection? Hepatitis B If you have a higher risk for hepatitis B, you should be screened for this virus. Talk with your health care provider to find out if you are at risk for hepatitis B infection. Hepatitis C Testing is recommended for:  Everyone born from [birth year] through 1967.  Anyone with known risk factors for hepatitis C. Sexually transmitted infections (STIs)  Get screened for STIs, including gonorrhea and chlamydia, if: ? You are sexually active and are younger than 50 years of age. ? You are older than 50 years of age and your health care provider tells you that you are at risk for this type of infection. ?  Your sexual activity has changed since you were last screened, and you are at increased risk for chlamydia or gonorrhea. Ask your health care provider if you are at risk.  Ask your health care provider about whether you are at high risk for HIV. Your health care provider may recommend a prescription medicine to help prevent HIV infection. If you choose to take medicine to prevent HIV, you should first get tested for HIV. You should then be tested every 3  months for as long as you are taking the medicine. Pregnancy  If you are about to stop having your period (premenopausal) and you may become pregnant, seek counseling before you get pregnant.  Take 400 to 800 micrograms (mcg) of folic acid every day if you become pregnant.  Ask for birth control (contraception) if you want to prevent pregnancy. Osteoporosis and menopause Osteoporosis is a disease in which the bones lose minerals and strength with aging. This can result in bone fractures. If you are 58 years old or older, or if you are at risk for osteoporosis and fractures, ask your health care provider if you should:  Be screened for bone loss.  Take a calcium or vitamin D supplement to lower your risk of fractures.  Be given hormone replacement therapy (HRT) to treat symptoms of menopause. Follow these instructions at home: Lifestyle  Do not use any products that contain nicotine or tobacco, such as cigarettes, e-cigarettes, and chewing tobacco. If you need help quitting, ask your health care provider.  Do not use street drugs.  Do not share needles.  Ask your health care provider for help if you need support or information about quitting drugs. Alcohol use  Do not drink alcohol if: ? Your health care provider tells you not to drink. ? You are pregnant, may be pregnant, or are planning to become pregnant.  If you drink alcohol: ? Limit how much you use to 0-1 drink a day. ? Limit intake if you are breastfeeding.  Be aware of how much alcohol is in your drink. In the [place] one drink equals one 12 oz bottle of beer (355 mL), one 5 oz glass of wine (148 mL), or one 1 oz glass of hard liquor (44 mL). General instructions  Schedule regular health, dental, and eye exams.  Stay current with your vaccines.  Tell your health care provider if: ? You often feel depressed. ? You have ever been abused or do not feel safe at home. Summary  Adopting a healthy lifestyle and getting  preventive care are important in promoting health and wellness.  Follow your health care provider's instructions about healthy diet, exercising, and getting tested or screened for diseases.  Follow your health care provider's instructions on monitoring your cholesterol and blood pressure. This information is not intended to replace advice given to you by your health care provider. Make sure you discuss any questions you have with your health care provider. Document Released: 09/22/2010 Document Revised: 03/02/2018 Document Reviewed: 03/02/2018 Elsevier Patient Education  2020 [employer] for Diabetes Mellitus, Adult  Carbohydrate counting is a method of keeping track of how many carbohydrates you eat. Eating carbohydrates naturally increases the amount of sugar (glucose) in the blood. Counting how many carbohydrates you eat helps keep your blood glucose within normal limits, which helps you manage your diabetes (diabetes mellitus). It is important to know how many carbohydrates you can safely have in each meal. This is different for every person. A diet and  nutrition specialist (registered dietitian) can help you make a meal plan and calculate how many carbohydrates you should have at each meal and snack. Carbohydrates are found in the following foods:  Grains, such as breads and cereals.  Dried beans and soy products.  Starchy vegetables, such as potatoes, peas, and corn.  Fruit and fruit juices.  Milk and yogurt.  Sweets and snack foods, such as cake, cookies, candy, chips, and soft drinks. How do I count carbohydrates? There are two ways to count carbohydrates in food. You can use either of the methods or a combination of both. Reading "Nutrition Facts" on packaged food The "Nutrition Facts" list is included on the labels of almost all packaged foods and beverages in the [place] It includes:  The serving size.  Information about nutrients in each serving,  including the grams (g) of carbohydrate per serving. To use the Nutrition Facts":  Decide how many servings you will have.  Multiply the number of servings by the number of carbohydrates per serving.  The resulting number is the total amount of carbohydrates that you will be having. Learning standard serving sizes of other foods When you eat carbohydrate foods that are not packaged or do not include "Nutrition Facts" on the label, you need to measure the servings in order to count the amount of carbohydrates:  Measure the foods that you will eat with a food scale or measuring cup, if needed.  Decide how many standard-size servings you will eat.  Multiply the number of servings by 15. Most carbohydrate-rich foods have about 15 g of carbohydrates per serving. ? For example, if you eat 8 oz (170 g) of strawberries, you will have eaten 2 servings and 30 g of carbohydrates (2 servings x 15 g = 30 g).  For foods that have more than one food mixed, such as soups and casseroles, you must count the carbohydrates in each food that is included. The following list contains standard serving sizes of common carbohydrate-rich foods. Each of these servings has about 15 g of carbohydrates:   hamburger bun or  English muffin.   oz (15 mL) syrup.   oz (14 g) jelly.  1 slice of bread.  1 six-inch tortilla.  3 oz (85 g) cooked rice or pasta.  4 oz (113 g) cooked dried beans.  4 oz (113 g) starchy vegetable, such as peas, corn, or potatoes.  4 oz (113 g) hot cereal.  4 oz (113 g) mashed potatoes or  of a large baked potato.  4 oz (113 g) canned or frozen fruit.  4 oz (120 mL) fruit juice.  4-6 crackers.  6 chicken nuggets.  6 oz (170 g) unsweetened dry cereal.  6 oz (170 g) plain fat-free yogurt or yogurt sweetened with artificial sweeteners.  8 oz (240 mL) milk.  8 oz (170 g) fresh fruit or one small piece of fruit.  24 oz (680 g) popped popcorn. Example of carbohydrate  counting Sample meal  3 oz (85 g) chicken breast.  6 oz (170 g) brown rice.  4 oz (113 g) corn.  8 oz (240 mL) milk.  8 oz (170 g) strawberries with sugar-free whipped topping. Carbohydrate calculation 1. Identify the foods that contain carbohydrates: ? Rice. ? Corn. ? Milk. ? Strawberries. 2. Calculate how many servings you have of each food: ? 2 servings rice. ? 1 serving corn. ? 1 serving milk. ? 1 serving strawberries. 3. Multiply each number of servings by 15 g: ? 2  servings rice x 15 g = 30 g. ? 1 serving corn x 15 g = 15 g. ? 1 serving milk x 15 g = 15 g. ? 1 serving strawberries x 15 g = 15 g. 4. Add together all of the amounts to find the total grams of carbohydrates eaten: ? 30 g + 15 g + 15 g + 15 g = 75 g of carbohydrates total. Summary  Carbohydrate counting is a method of keeping track of how many carbohydrates you eat.  Eating carbohydrates naturally increases the amount of sugar (glucose) in the blood.  Counting how many carbohydrates you eat helps keep your blood glucose within normal limits, which helps you manage your diabetes.  A diet and nutrition specialist (registered dietitian) can help you make a meal plan and calculate how many carbohydrates you should have at each meal and snack. This information is not intended to replace advice given to you by your health care provider. Make sure you discuss any questions you have with your health care provider. Document Released: 03/09/2005 Document Revised: 10/01/2016 Document Reviewed: 08/21/2015 Elsevier Patient Education  2020 [employer].

## 2019-01-23 NOTE — Progress Notes (Signed)
KIMISHA KEITH 13-Jan-1971 [id number]    History:    Presents for annual exam.  Regular monthly cycles until  January, amenorrheic for 6 months until July.  Cycles since July every 2 to 4 weeks, last 2 cycles 3 to 4 weeks for 4 to 5 days with extremely heavy flow with cramping.  Not sexually active in years.  2013 ASCUS with positive high risk HPV with normal Paps after.  2013 benign colon polyp 5-year follow-up overdue.  History of RA, IBS and GERD.  HSV no outbreaks.  Past medical history, past surgical history, family history and social history were all reviewed and documented in the EPIC chart.  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  Parents healthy.  ROS:  A ROS was performed and pertinent positives and negatives are included.  Exam:  Vitals:   01/23/19 0932  BP: 126/80  Weight: 233 lb (105.7 kg)  Height: 5\' 6"  (1.676 m)   Body mass index is 37.61 kg/m.   General appearance:  Normal Thyroid:  Symmetrical, normal in size, without palpable masses or nodularity. Respiratory  Auscultation:  Clear without wheezing or rhonchi Cardiovascular  Auscultation:  Regular rate, without rubs, murmurs or gallops  Edema/varicosities:  Not grossly evident Abdominal  Soft,nontender, without masses, guarding or rebound.  Liver/spleen:  No organomegaly noted  Hernia:  None appreciated  Skin  Inspection:  Grossly normal   Breasts: Examined lying and sitting.     Right: Without masses, retractions, discharge or axillary adenopathy.     Left: Without masses, retractions, discharge or axillary adenopathy. Gentitourinary   Inguinal/mons:  Normal without inguinal adenopathy  External genitalia:  Normal  BUS/Urethra/Skene's glands:  Normal  Vagina:  Normal  Cervix:  Normal  Uterus:  normal in size, shape and contour.  Midline and mobile  Adnexa/parametria:     Rt: Without masses or tenderness.   Lt: Without masses or tenderness.  Anus and perineum: Normal  Digital rectal  exam: Normal sphincter tone without palpated masses or tenderness  Assessment/Plan:  48 y.o. S WF G2 P0 for annual exam with irregular cycles every 14 to 28 days for the past 4 months, amenorrheic 6 months prior.  Perimenopausal/irregular cycles/not sexually active Obesity GERD, IBS HSV-no outbreaks  ADHD, insomnia, meds and labs-primary care 2013 ASCUS positive high risk HPV with normal Paps after 2013 benign colon polyp 5-year follow-up  Plan: Valtrex 500 twice daily for 3 to 5 days as needed, prescription, proper use given and reviewed.  Sleep hygiene reviewed.  SBEs, annual screening mammogram, calcium rich foods, continue vitamin D supplement per primary care.  Reviewed changes of menstrual cycles most likely due to perimenopausal state.  Reviewed if cycles are less than 21 days from first day to first day instructed to call or return to proceed with sonohysterogram.  Encouraged to increase regular cardio type exercise, decrease calorie/carbs, self-care and leisure activities encouraged.  Repeat colonoscopy due, instructed to contact Dr. Deatra Ina at [facility].  Pap normal 2019, negative high-risk HPV new screening guidelines reviewed.   Huel Cote [facility], 9:51 AM 01/23/2019

## 2019-01-24 LAB — CBC WITH DIFFERENTIAL/PLATELET
Absolute Monocytes: 497 cells/uL (ref 200–950)
Basophils Absolute: 49 cells/uL (ref 0–200)
Basophils Relative: 0.9 %
Eosinophils Absolute: 70 cells/uL (ref 15–500)
Eosinophils Relative: 1.3 %
HCT: 43.1 % (ref 35.0–45.0)
Hemoglobin: 14.5 g/dL (ref 11.7–15.5)
Lymphs Abs: 1782 cells/uL (ref 850–3900)
MCH: 30.1 pg (ref 27.0–33.0)
MCHC: 33.6 g/dL (ref 32.0–36.0)
MCV: 89.6 fL (ref 80.0–100.0)
MPV: 11.1 fL (ref 7.5–12.5)
Monocytes Relative: 9.2 %
Neutro Abs: 3002 cells/uL (ref 1500–7800)
Neutrophils Relative %: 55.6 %
Platelets: 300 10*3/uL (ref 140–400)
RBC: 4.81 10*6/uL (ref 3.80–5.10)
RDW: 12.5 % (ref 11.0–15.0)
Total Lymphocyte: 33 %
WBC: 5.4 10*3/uL (ref 3.8–10.8)

## 2019-01-24 LAB — COMPREHENSIVE METABOLIC PANEL
AG Ratio: 1.5 (calc) (ref 1.0–2.5)
ALT: 22 U/L (ref 6–29)
AST: 23 U/L (ref 10–35)
Albumin: 4.5 g/dL (ref 3.6–5.1)
Alkaline phosphatase (APISO): 74 U/L (ref 37–153)
BUN: 15 mg/dL (ref 7–25)
CO2: 24 mmol/L (ref 20–32)
Calcium: 10 mg/dL (ref 8.6–10.4)
Chloride: 101 mmol/L (ref 98–110)
Creat: 0.93 mg/dL (ref 0.50–1.05)
Globulin: 3 g/dL (calc) (ref 1.9–3.7)
Glucose, Bld: 77 mg/dL (ref 65–99)
Potassium: 4.3 mmol/L (ref 3.5–5.3)
Sodium: 137 mmol/L (ref 135–146)
Total Bilirubin: 0.4 mg/dL (ref 0.2–1.2)
Total Protein: 7.5 g/dL (ref 6.1–8.1)

## 2019-01-24 LAB — LIPID PANEL
Cholesterol: 172 mg/dL (ref <200)
HDL: 59 mg/dL (ref >=50)
LDL Cholesterol (Calc): 92 mg/dL (calc)
Non-HDL Cholesterol (Calc): 113 mg/dL (calc) (ref <130)
Total CHOL/HDL Ratio: 2.9 (calc) (ref <5.0)
Triglycerides: 118 mg/dL (ref <150)

## 2019-01-30 DIAGNOSIS — M79645 Pain in left finger(s): Secondary | ICD-10-CM | POA: Insufficient documentation

## 2019-03-25 ENCOUNTER — Other Ambulatory Visit: Payer: Self-pay | Admitting: Women's Health

## 2019-04-04 ENCOUNTER — Other Ambulatory Visit: Payer: Self-pay | Admitting: Family Medicine

## 2019-04-04 ENCOUNTER — Other Ambulatory Visit: Payer: Self-pay | Admitting: Women's Health

## 2019-04-17 ENCOUNTER — Other Ambulatory Visit: Payer: Self-pay

## 2019-04-17 ENCOUNTER — Ambulatory Visit (INDEPENDENT_AMBULATORY_CARE_PROVIDER_SITE_OTHER): Payer: BC Managed Care – PPO | Admitting: Family Medicine

## 2019-04-17 ENCOUNTER — Encounter: Payer: Self-pay | Admitting: Family Medicine

## 2019-04-17 VITALS — BP 132/74 | HR 84 | Temp 97.6°F | Resp 16 | Ht 66.0 in | Wt 239.0 lb

## 2019-04-17 DIAGNOSIS — R350 Frequency of micturition: Secondary | ICD-10-CM | POA: Diagnosis not present

## 2019-04-17 DIAGNOSIS — R1011 Right upper quadrant pain: Secondary | ICD-10-CM | POA: Diagnosis not present

## 2019-04-17 LAB — COMPLETE METABOLIC PANEL WITH GFR
AG Ratio: 1.6 (calc) (ref 1.0–2.5)
ALT: 18 U/L (ref 6–29)
AST: 18 U/L (ref 10–35)
Albumin: 4.2 g/dL (ref 3.6–5.1)
Alkaline phosphatase (APISO): 72 U/L (ref 37–153)
BUN: 12 mg/dL (ref 7–25)
CO2: 30 mmol/L (ref 20–32)
Calcium: 9.5 mg/dL (ref 8.6–10.4)
Chloride: 104 mmol/L (ref 98–110)
Creat: 0.93 mg/dL (ref 0.50–1.05)
GFR, Est African American: 83 mL/min/{1.73_m2} (ref >=60)
GFR, Est Non African American: 72 mL/min/{1.73_m2} (ref >=60)
Globulin: 2.7 g/dL (calc) (ref 1.9–3.7)
Glucose, Bld: 79 mg/dL (ref 65–99)
Potassium: 4.6 mmol/L (ref 3.5–5.3)
Sodium: 139 mmol/L (ref 135–146)
Total Bilirubin: 0.4 mg/dL (ref 0.2–1.2)
Total Protein: 6.9 g/dL (ref 6.1–8.1)

## 2019-04-17 LAB — URINALYSIS, ROUTINE W REFLEX MICROSCOPIC
Bilirubin Urine: NEGATIVE
Glucose, UA: NEGATIVE
Hgb urine dipstick: NEGATIVE
Ketones, ur: NEGATIVE
Leukocytes,Ua: NEGATIVE
Nitrite: NEGATIVE
Protein, ur: NEGATIVE
Specific Gravity, Urine: 1.02 (ref 1.001–1.03)
pH: 7 (ref 5.0–8.0)

## 2019-04-17 LAB — CBC WITH DIFFERENTIAL/PLATELET
Absolute Monocytes: 543 cells/uL (ref 200–950)
Basophils Absolute: 50 cells/uL (ref 0–200)
Basophils Relative: 0.9 %
Eosinophils Absolute: 78 cells/uL (ref 15–500)
Eosinophils Relative: 1.4 %
HCT: 42 % (ref 35.0–45.0)
Hemoglobin: 14.3 g/dL (ref 11.7–15.5)
Lymphs Abs: 1630 cells/uL (ref 850–3900)
MCH: 30.2 pg (ref 27.0–33.0)
MCHC: 34 g/dL (ref 32.0–36.0)
MCV: 88.8 fL (ref 80.0–100.0)
MPV: 10.9 fL (ref 7.5–12.5)
Monocytes Relative: 9.7 %
Neutro Abs: 3298 cells/uL (ref 1500–7800)
Neutrophils Relative %: 58.9 %
Platelets: 279 10*3/uL (ref 140–400)
RBC: 4.73 10*6/uL (ref 3.80–5.10)
RDW: 12.7 % (ref 11.0–15.0)
Total Lymphocyte: 29.1 %
WBC: 5.6 10*3/uL (ref 3.8–10.8)

## 2019-04-17 LAB — LIPASE: Lipase: 33 U/L (ref 7–60)

## 2019-04-17 LAB — AMYLASE: Amylase: 32 U/L (ref 21–101)

## 2019-04-17 MED ORDER — PANTOPRAZOLE SODIUM 40 MG PO TBEC: 40.0000 mg | DELAYED_RELEASE_TABLET | Freq: Every day | ORAL | 3 refills | Status: DC

## 2019-04-17 NOTE — Progress Notes (Signed)
Subjective:    Patient ID: Catherine Rodriguez, female    DOB: 08-Jun-1970, 49 y.o.   MRN: [id number]  HPI  Patient is a 49 year old Caucasian female who presents today with a 2-week history of right upper quadrant abdominal pain.  The pain is located slightly inferior and lateral to the location of the gallbladder.  She states the pain is also constant however after questioning her further, she does admit that the pain seems to worsen a few hours after eating.  However it never completely subsides.  She denies any fevers or chills.  She is not jaundiced.  She denies any itching of the skin.  She denies any nausea or vomiting.  I am unable to reproduce the pain with palpation.  She has a negative Murphy sign.  She denies any melena or hematochezia.  She does have a history of IBS but she states that this pain is different than any she is experienced before.  The pain is sharp at times.  She denies any constipation.  She denies any vomiting.  She denies any dysuria hematuria urgency.  Urinalysis today is completely normal.  She denies any vaginal bleeding or vaginal discharge. Past Medical History:  Diagnosis Date  . ASCUS with positive high risk HPV cervical 2013   normal colposcopy  . GERD (gastroesophageal reflux disease)   . Herpes simplex   . IBS (irritable bowel syndrome)    Past Surgical History:  Procedure Laterality Date  . APPENDECTOMY  1993  . eyellid  2010  . THERAPEUTIC ABORTION     X 2  . tubes in ears     as a child   Current Outpatient Medications on File Prior to Visit  Medication Sig Dispense Refill  . ALPRAZolam (XANAX) 1 MG tablet as directed.  0  . amphetamine-dextroamphetamine (ADDERALL, 30MG ,) 30 MG tablet Take 30 mg by mouth 2 (two) times daily.      . D3-50 50000 units capsule Take 1 tablet by mouth once a week.  5  . eszopiclone (LUNESTA) 1 MG TABS tablet Take 1 mg by mouth at bedtime as needed for sleep. Take immediately before bedtime    . ibuprofen (ADVIL) 800  MG tablet Take 1 tablet (800 mg total) by mouth every 8 (eight) hours as needed. 90 tablet 4  . promethazine (PHENERGAN) 25 MG tablet TAKE 1 TABLET BY MOUTH EVERY 8 HOURS AS NEEDED FOR NAUSEA FOR VOMITING 20 tablet 0  . triamcinolone cream (KENALOG) 0.1 % Apply 1 application topically 2 (two) times daily. 30 g 0  . valACYclovir (VALTREX) 500 MG tablet Take 1 tablet (500 mg total) by mouth 2 (two) times daily. At onset of outbreak for 5 days 30 tablet 6   No current facility-administered medications on file prior to visit.   Allergies  Allergen Reactions  . Doxycycline Nausea And Vomiting  . Macrobid [Nitrofurantoin Macrocrystal] Nausea And Vomiting  . Penicillins     Got real sick   Social History   Socioeconomic History  . Marital status: Single    Spouse name: Not on file  . Number of children: Not on file  . Years of education: Not on file  . Highest education level: Not on file  Occupational History  . Not on file  Tobacco Use  . Smoking status: Never Smoker  . Smokeless tobacco: Never Used  Substance and Sexual Activity  . Alcohol use: Yes    Alcohol/week: 0.0 standard drinks    Comment: Occas wine  .  Drug use: No  . Sexual activity: Not Currently    Comment: 1st intercourse 49 yo-Fewer than 5 partners  Other Topics Concern  . Not on file  Social History Narrative  . Not on file   Social Determinants of Health   Financial Resource Strain:   . Difficulty of Paying Living Expenses: Not on file  Food Insecurity:   . Worried About Charity fundraiser in the Last Year: Not on file  . Ran Out of Food in the Last Year: Not on file  Transportation Needs:   . Lack of Transportation (Medical): Not on file  . Lack of Transportation (Non-Medical): Not on file  Physical Activity:   . Days of Exercise per Week: Not on file  . Minutes of Exercise per Session: Not on file  Stress:   . Feeling of Stress : Not on file  Social Connections:   . Frequency of Communication with  Friends and Family: Not on file  . Frequency of Social Gatherings with Friends and Family: Not on file  . Attends Religious Services: Not on file  . Active Member of Clubs or Organizations: Not on file  . Attends Archivist Meetings: Not on file  . Marital Status: Not on file  Intimate Partner Violence:   . Fear of Current or Ex-Partner: Not on file  . Emotionally Abused: Not on file  . Physically Abused: Not on file  . Sexually Abused: Not on file     Review of Systems  All other systems reviewed and are negative.      Objective:   Physical Exam Vitals reviewed.  Constitutional:      Appearance: Normal appearance.  Cardiovascular:     Rate and Rhythm: Normal rate and regular rhythm.     Heart sounds: Normal heart sounds.  Pulmonary:     Effort: Pulmonary effort is normal. No respiratory distress.     Breath sounds: Normal breath sounds. No wheezing.  Abdominal:     General: Abdomen is flat. Bowel sounds are normal. There is no distension.     Palpations: Abdomen is soft. There is no mass.     Tenderness: There is no abdominal tenderness. There is no right CVA tenderness or guarding.     Hernia: No hernia is present.    Neurological:     Mental Status: She is alert.           Assessment & Plan:  Frequency of urination - Plan: Urinalysis, Routine w reflex microscopic  RUQ pain - Plan: CBC with Differential, Amylase, Lipase, COMPLETE METABOLIC PANEL WITH GFR, US Abdomen Limited RUQ, pantoprazole (PROTONIX) 40 MG tablet  Last likely explanation biliary colic.  Less likely would be a duodenal ulcer.  Begin the patient empirically on pantoprazole 40 mg a day.  Meanwhile schedule the patient for right upper quadrant ultrasound to evaluate further.  If ultrasound confirms gallstones and the pantoprazole does not help her pain, I will consult general surgery.  If ultrasound does not show gallstones and the pantoprazole helps her pain, I will treat the patient for  8 weeks for possible duodenal ulcer.  Obtain a CBC, CMP, and lipase.  Urinalysis was benign.

## 2019-04-18 ENCOUNTER — Ambulatory Visit
Admission: RE | Admit: 2019-04-18 | Discharge: 2019-04-18 | Disposition: A | Discharge status: Home / Self Care | Payer: BC Managed Care – PPO | Source: Ambulatory Visit | Attending: Family Medicine | Admitting: Family Medicine

## 2019-04-18 ENCOUNTER — Other Ambulatory Visit: Payer: Self-pay | Admitting: Family Medicine

## 2019-04-18 DIAGNOSIS — R1011 Right upper quadrant pain: Secondary | ICD-10-CM

## 2019-04-18 DIAGNOSIS — K828 Other specified diseases of gallbladder: Secondary | ICD-10-CM

## 2019-04-24 ENCOUNTER — Telehealth: Payer: Self-pay | Admitting: Family Medicine

## 2019-04-24 NOTE — Telephone Encounter (Signed)
Pt called and states that her pain is gone but only when she takes the protonix bid and will need a rx for bid. She is pretty sure it is an ulcer cause when she stops the med or only takes it qd her pain will come back. She wanted to know what to do about it?

## 2019-04-24 NOTE — Telephone Encounter (Signed)
Tried to call no answer and no vm 

## 2019-04-24 NOTE — Telephone Encounter (Signed)
Take protonix 40 bid for 8 weeks total then reduce to once daily once ulcer hopefully has healed.  If no better, will need egd.

## 2019-05-10 NOTE — Telephone Encounter (Signed)
Pt aware via vm 

## 2019-06-08 ENCOUNTER — Other Ambulatory Visit: Payer: Self-pay | Admitting: Family Medicine

## 2019-06-08 DIAGNOSIS — R1011 Right upper quadrant pain: Secondary | ICD-10-CM

## 2019-06-08 MED ORDER — PANTOPRAZOLE SODIUM 40 MG PO TBEC: 40.0000 mg | DELAYED_RELEASE_TABLET | Freq: Two times a day (BID) | ORAL | 3 refills | Status: DC

## 2019-06-13 ENCOUNTER — Ambulatory Visit (INDEPENDENT_AMBULATORY_CARE_PROVIDER_SITE_OTHER): Payer: BC Managed Care – PPO | Admitting: Nurse Practitioner

## 2019-06-13 ENCOUNTER — Other Ambulatory Visit: Payer: Self-pay

## 2019-06-13 DIAGNOSIS — J069 Acute upper respiratory infection, unspecified: Secondary | ICD-10-CM | POA: Diagnosis not present

## 2019-06-13 MED ORDER — AZITHROMYCIN 250 MG PO TABS: ORAL_TABLET | ORAL | 0 refills | Status: DC

## 2019-06-13 MED ORDER — HYDROCODONE-HOMATROPINE 5-1.5 MG/5ML PO SYRP: 5.0000 mL | ORAL_SOLUTION | Freq: Three times a day (TID) | ORAL | 0 refills | Status: DC | PRN

## 2019-06-13 NOTE — Progress Notes (Signed)
Telephone Visit  Subjective:    Patient ID: Catherine Rodriguez, female    DOB: December 29, 1970, 49 y.o.   MRN: [id number]  Telephone Note    I connected with Catherine Rodriguez on 06/13/2019 at 1124 by telephone did not desire apt for virtual visit per schedularand verified that I am speaking with the correct person using two identifiers.  Pt location: at home   Physician location:  In office, [place], Ishmael Holter, FNP-C    On call: patient and physician   I discussed the limitations, risks, security and privacy concerns of performing an evaluation and management service by telephone and the availability of in person appointments. I also discussed with the patient that there may be a patient responsible charge related to this service. The patient expressed understanding and agreed to proceed.  History of Present Illness: Pt is a 49 year old presenting for telephone visit related to sxs of non productive cough, with nasal/chest congestion. She denied general body aches, loss of taste, loss of smell, and pain, fever/chills or exposure to COVID or sick persons. Txs tried are Mucinex, robitussin, Tylenol sinus, not helping. Her mother gave her cough syrup Hycodan that did help But she feels the phlegm is not coming out and her mother is out of the medication. Related to the cough she has developed horse throat, She reports #1 Covid vaccine Thursday before last. No cp/ct, gu/gi sxs, pain, sob.  Past Medical History:  Diagnosis Date  . ASCUS with positive high risk HPV cervical 2013   normal colposcopy  . GERD (gastroesophageal reflux disease)   . Herpes simplex   . IBS (irritable bowel syndrome)     Past Surgical History:  Procedure Laterality Date  . APPENDECTOMY  1993  . eyellid  2010  . THERAPEUTIC ABORTION     X 2  . tubes in ears     as a child    Family History  Problem Relation Age of Onset  . Breast cancer Sister 84  . Hypertension Father   .  Diabetes Paternal Grandmother   . Asthma Mother     Social History   Socioeconomic History  . Marital status: Single    Spouse name: Not on file  . Number of children: Not on file  . Years of education: Not on file  . Highest education level: Not on file  Occupational History  . Not on file  Tobacco Use  . Smoking status: Never Smoker  . Smokeless tobacco: Never Used  Substance and Sexual Activity  . Alcohol use: Yes    Alcohol/week: 0.0 standard drinks    Comment: Occas wine  . Drug use: No  . Sexual activity: Not Currently    Comment: 1st intercourse 49 yo-Fewer than 5 partners  Other Topics Concern  . Not on file  Social History Narrative  . Not on file   Social Determinants of Health   Financial Resource Strain:   . Difficulty of Paying Living Expenses:   Food Insecurity:   . Worried About Charity fundraiser in the Last Year:   . Arboriculturist in the Last Year:   Transportation Needs:   . Film/video editor (Medical):   Marland Kitchen Lack of Transportation (Non-Medical):   Physical Activity:   . Days of Exercise per Week:   . Minutes of Exercise per Session:   Stress:   . Feeling of Stress :   Social Connections:   . Frequency  of Communication with Friends and Family:   . Frequency of Social Gatherings with Friends and Family:   . Attends Religious Services:   . Active Member of Clubs or Organizations:   . Attends Archivist Meetings:   Marland Kitchen Marital Status:   Intimate Partner Violence:   . Fear of Current or Ex-Partner:   . Emotionally Abused:   Marland Kitchen Physically Abused:   . Sexually Abused:     Outpatient Medications Prior to Visit  Medication Sig Dispense Refill  . ALPRAZolam (XANAX) 1 MG tablet as directed.  0  . amphetamine-dextroamphetamine (ADDERALL, 30MG ,) 30 MG tablet Take 30 mg by mouth 2 (two) times daily.      . D3-50 50000 units capsule Take 1 tablet by mouth once a week.  5  . eszopiclone (LUNESTA) 1 MG TABS tablet Take 1 mg by mouth at  bedtime as needed for sleep. Take immediately before bedtime    . ibuprofen (ADVIL) 800 MG tablet Take 1 tablet (800 mg total) by mouth every 8 (eight) hours as needed. 90 tablet 4  . pantoprazole (PROTONIX) 40 MG tablet Take 1 tablet (40 mg total) by mouth 2 (two) times daily. 180 tablet 3  . promethazine (PHENERGAN) 25 MG tablet TAKE 1 TABLET BY MOUTH EVERY 8 HOURS AS NEEDED FOR NAUSEA FOR VOMITING 20 tablet 0  . triamcinolone cream (KENALOG) 0.1 % Apply 1 application topically 2 (two) times daily. 30 g 0  . valACYclovir (VALTREX) 500 MG tablet Take 1 tablet (500 mg total) by mouth 2 (two) times daily. At onset of outbreak for 5 days 30 tablet 6   No facility-administered medications prior to visit.    Allergies  Allergen Reactions  . Doxycycline Nausea And Vomiting  . Macrobid [Nitrofurantoin Macrocrystal] Nausea And Vomiting  . Penicillins     Got real sick    Observations/Objective: NAD noted, able to speak in full sentences, does not sound SOB, no wheezing heard  Assessment and Plan: You sxs are consistent with URI Upper Respiratory Infection, Adult An upper respiratory infection (URI) affects the nose, throat, and upper air passages. URIs are caused by germs (viruses). The most common type of URI is often called "the common cold." Medicines cannot cure URIs, but you can do things at home to relieve your symptoms. URIs usually get better within 7-10 days. Follow these instructions at home: Activity  Rest as needed.  If you have a fever, stay home from work or school until your fever is gone, or until your doctor says you may return to work or school. ? You should stay home until you cannot spread the infection anymore (you are not contagious). ? Your doctor may have you wear a face mask so you have less risk of spreading the infection. Relieving symptoms  Gargle with a salt-water mixture 3-4 times a day or as needed. To make a salt-water mixture, completely dissolve -1  tsp of salt in 1 cup of warm water.  Use a cool-mist humidifier to add moisture to the air. This can help you breathe more easily. Eating and drinking    Drink enough fluid to keep your pee (urine) pale yellow.  Eat soups and other clear broths. General instructions    Take over-the-counter and prescription medicines only as told by your doctor. These include cold medicines, fever reducers, and cough suppressants.  Do not use any products that contain nicotine or tobacco. These include cigarettes and e-cigarettes. If you need help quitting, ask your doctor.  Avoid being where people are smoking (avoid secondhand smoke).  Make sure you get regular shots and get the flu shot every year.  Keep all follow-up visits as told by your doctor. This is important. How to avoid spreading infection to others    Wash your hands often with soap and water. If you do not have soap and water, use hand sanitizer.  Avoid touching your mouth, face, eyes, or nose.  Cough or sneeze into a tissue or your sleeve or elbow. Do not cough or sneeze into your hand or into the air. Contact a doctor if:  You are getting worse, not better.  You have any of these: ? A fever. ? Chills. ? Brown or red mucus in your nose. ? Yellow or brown fluid (discharge)coming from your nose. ? Pain in your face, especially when you bend forward. ? Swollen neck glands. ? Pain with swallowing. ? White areas in the back of your throat. Get help right away if:  You have shortness of breath that gets worse.  You have very bad or constant: ? Headache. ? Ear pain. ? Pain in your forehead, behind your eyes, and over your cheekbones (sinus pain). ? Chest pain.  You have long-lasting (chronic) lung disease along with any of these: ? Wheezing. ? Long-lasting cough. ? Coughing up blood. ? A change in your usual mucus.  You have a stiff neck.  You have changes in  your: ? Vision. ? Hearing. ? Thinking. ? Mood. Summary  An upper respiratory infection (URI) is caused by a germ called a virus. The most common type of URI is often called "the common cold."  URIs usually get better within 7-10 days.  Take over-the-counter and prescription medicines only as told by your doctor. This information is not intended to replace advice given to you by your health care provider. Make sure you discuss any questions you have with your health care provider. Document Revised: 03/17/2018 Document Reviewed: 10/30/2016 Elsevier Patient Education  [place] and complete antibiotic Cough suppressant as needed May take over the counter medications for sxs relief COVID test today [facility] number provided.  Follow Up Instructions: if symptoms worsen or fail to improve  I discussed the assessment and treatment plan with the patient. The patient was provided an opportunity to ask questions and all were answered. The patient agreed with the plan and demonstrated an understanding of the instructions.  The patient was advised to call back or seek an in-person evaluation if the symptoms worsen or if the condition fails to improve as anticipated.  I provided 10 minutes of non-face-to-face time during this encounter. [place], FNP-C

## 2019-06-14 ENCOUNTER — Ambulatory Visit: Payer: BC Managed Care – PPO | Attending: Internal Medicine

## 2019-06-14 DIAGNOSIS — Z20822 Contact with and (suspected) exposure to covid-19: Secondary | ICD-10-CM

## 2019-06-15 LAB — SARS-COV-2, NAA 2 DAY TAT

## 2019-06-15 LAB — NOVEL CORONAVIRUS, NAA: SARS-CoV-2, NAA: NOT DETECTED

## 2019-06-16 ENCOUNTER — Telehealth: Payer: Self-pay | Admitting: General Practice

## 2019-06-16 NOTE — Telephone Encounter (Signed)
Negative COVID results given. Patient results "NOT Detected." Caller expressed understanding. ° °

## 2019-06-22 ENCOUNTER — Ambulatory Visit (INDEPENDENT_AMBULATORY_CARE_PROVIDER_SITE_OTHER): Payer: BC Managed Care – PPO | Admitting: Nurse Practitioner

## 2019-06-22 ENCOUNTER — Other Ambulatory Visit: Payer: Self-pay

## 2019-06-22 DIAGNOSIS — J069 Acute upper respiratory infection, unspecified: Secondary | ICD-10-CM

## 2019-06-22 DIAGNOSIS — R059 Cough, unspecified: Secondary | ICD-10-CM

## 2019-06-22 DIAGNOSIS — R05 Cough: Secondary | ICD-10-CM | POA: Diagnosis not present

## 2019-06-22 MED ORDER — PREDNISONE 10 MG PO TABS: ORAL_TABLET | ORAL | 0 refills | Status: DC

## 2019-06-22 MED ORDER — HYDROCOD POLST-CPM POLST ER 10-8 MG/5ML PO SUER: 5.0000 mL | Freq: Two times a day (BID) | ORAL | 0 refills | Status: DC | PRN

## 2019-06-22 MED ORDER — LEVOFLOXACIN 500 MG PO TABS: 500.0000 mg | ORAL_TABLET | Freq: Every day | ORAL | 0 refills | Status: DC

## 2019-06-22 NOTE — Progress Notes (Signed)
Telephone Note    I connected with Virtual Visit via Telephone Note    I connected with Tkeyah P. Leabo  On 06/22/2019 at 2:00 PM by telephoneand verified that I am speaking with the correct person using two identifiers.  Pt location: at home   Physician location:  In office, [place], Ishmael Holter, FNP-C    On call: patient and physician   I discussed the limitations, risks, security and privacy concerns of performing an evaluation and management service by telephone and the availability of in person appointments. I also discussed with the patient that there may be a patient responsible charge related to this service. The patient expressed understanding and agreed to proceed.   History of Present Illness: Pt is a 49 year old female presenting for telephone call still not feeling better. Eight days ago she completed a telephone visit and COVID test which was negative.  On that visit her sxs where of non productive cough, with nasal/chest congestion. She denied general body aches, loss of taste, loss of smell, and pain, fever/chills or exposure to COVID or sick persons. Txs tried are Mucinex, robitussin, Tylenol sinus, not helping. Her mother gave her cough syrup Hycodan that did help. she feels the phlegm is not coming out and her mother is out of the medication. Related to the cough she has developed horse throat, She reports #1 Covid vaccine Thursday before last. No cp/ct, gu/gi sxs, pain, sob. Her assessment and treatment plan was URI: Start and complete antibiotic, Cough suppressant as needed, May take over the counter medications for sxs relief, COVID test today [facility] number provided.  06/22/2019 Today her cough is her predmoniant sxs. She stated the cough can produce dark yellow phlegm. She reports that she can feeel the phlem in her chest breaking up but remains mildly tight but improved since completing the antibiotic. She reports that she is tired of  coughing and have used Hycodan that does not seem to work. Pt denied fever chills gu/gi sxs, pain, general body aches, sob, edema. Her Covdis test was negative. She reports she received her second COVID vaccine yesterday. She request that the cough medication that she used of her mothers noted last visit be prescribed.   Past Medical History:  Diagnosis Date  . ASCUS with positive high risk HPV cervical 2013   normal colposcopy  . GERD (gastroesophageal reflux disease)   . Herpes simplex   . IBS (irritable bowel syndrome)    Past Surgical History:  Procedure Laterality Date  . APPENDECTOMY  1993  . eyellid  2010  . THERAPEUTIC ABORTION     X 2  . tubes in ears     as a child   Allergies  Allergen Reactions  . Doxycycline Nausea And Vomiting  . Macrobid [Nitrofurantoin Macrocrystal] Nausea And Vomiting  . Penicillins     Got real sick    Observations/Objective: NAD noted, able to speak in full sentences no shortness of breath, coughing while on call, a/ox4, no wheezing heard  Assessment and Plan: You have completed the treatment for URI that included a dose pack of azithromycin and hycodan for cough with negative COVID test.   With your antibiotic allergies I will prescribe Levaquin and a different cough medication.  Prednisone has been prescribed as well take as directed.   Upper respiratory tract infection, unspecified type - Plan: levofloxacin (LEVAQUIN) 500 MG tablet, predniSONE (DELTASONE) 10 MG tablet  Cough in adult patient - Plan: chlorpheniramine-HYDROcodone (TUSSIONEX PENNKINETIC  ER) 10-8 MG/5ML SUER, predniSONE (DELTASONE) 10 MG tablet    Follow Up Instructions:  I discussed the assessment and treatment plan with the patient. The patient was provided an opportunity to ask questions and all were answered. The patient agreed with the plan and demonstrated an understanding of the instructions.  The patient was advised to call back or seek an in-person  evaluation if the symptoms worsen or if the condition fails to improve as anticipated.  I provided 15  minutes of non-face-to-face time during this encounter. End Time 2:15  Ishmael Holter, FNP-C

## 2019-06-27 ENCOUNTER — Telehealth: Payer: Self-pay

## 2019-06-27 NOTE — Telephone Encounter (Signed)
Pt called to give information on her pharmacist. Javier Docker Karn Pickler [phone]

## 2019-06-27 NOTE — Telephone Encounter (Signed)
Pt stated that you wanted to know her pharmacist.

## 2019-06-27 NOTE — Telephone Encounter (Signed)
Do I need to do something with this?

## 2019-06-30 ENCOUNTER — Telehealth: Payer: Self-pay | Admitting: Family Medicine

## 2019-06-30 NOTE — Telephone Encounter (Signed)
Pt aware and apt made 

## 2019-06-30 NOTE — Telephone Encounter (Signed)
I would need to see and examine her in the office as she has been seen by [facility] 2 over the phone.  Not sure what's going on.

## 2019-06-30 NOTE — Telephone Encounter (Signed)
Pt called and states that she is still not doing any better. She is still coughing and wheezing a lot and cough med no help she is up most of the night and now the cough is bad during the day. She has been on several antibx's and prednisone. Please advise?

## 2019-07-03 ENCOUNTER — Other Ambulatory Visit: Payer: Self-pay

## 2019-07-03 ENCOUNTER — Ambulatory Visit
Admission: RE | Admit: 2019-07-03 | Discharge: 2019-07-03 | Disposition: A | Discharge status: Home / Self Care | Payer: BC Managed Care – PPO | Source: Ambulatory Visit | Attending: Family Medicine | Admitting: Family Medicine

## 2019-07-03 ENCOUNTER — Ambulatory Visit (INDEPENDENT_AMBULATORY_CARE_PROVIDER_SITE_OTHER): Payer: BC Managed Care – PPO | Admitting: Family Medicine

## 2019-07-03 ENCOUNTER — Encounter: Payer: Self-pay | Admitting: Family Medicine

## 2019-07-03 VITALS — BP 130/78 | HR 86 | Temp 97.2°F | Resp 16 | Ht 66.0 in | Wt 238.0 lb

## 2019-07-03 DIAGNOSIS — R059 Cough, unspecified: Secondary | ICD-10-CM

## 2019-07-03 DIAGNOSIS — R05 Cough: Secondary | ICD-10-CM

## 2019-07-03 MED ORDER — HYDROCOD POLST-CPM POLST ER 10-8 MG/5ML PO SUER: 5.0000 mL | Freq: Two times a day (BID) | ORAL | 0 refills | Status: AC | PRN

## 2019-07-03 NOTE — Progress Notes (Signed)
Subjective:    Patient ID: BLIMY TUDOR, female    DOB: [birth year], 49 y.o.   MRN: [id number]  HPI  Patient is a 49 year old Caucasian female who developed a cough approximately 8 weeks ago.  The cough occurred shortly after she received her second Covid vaccination.  She developed runny nose, postnasal drip, head congestion, and shortness of breath.  She was initially put on a Z-Pak and Hycodan.  This provided her no relief.  She was recently seen by my PA as a telephone visit and was started on prednisone in addition to [place].  She continues to have a persistent nonproductive cough.  She also reports congestion in her chest, she now has bilateral bibasilar pleurisy.  She denies any hemoptysis.  She denies any angina.  She denies any nausea or vomiting.  She denies any heartburn or postnasal drip or sinus pain.  On physical exam today she does have diminished breath sounds bilaterally with faint expiratory wheezing.  She also has right basilar crackles. Past Medical History:  Diagnosis Date  . ASCUS with positive high risk HPV cervical 2013   normal colposcopy  . GERD (gastroesophageal reflux disease)   . Herpes simplex   . IBS (irritable bowel syndrome)    Past Surgical History:  Procedure Laterality Date  . APPENDECTOMY  1993  . eyellid  2010  . THERAPEUTIC ABORTION     X 2  . tubes in ears     as a child   Current Outpatient Medications on File Prior to Visit  Medication Sig Dispense Refill  . ALPRAZolam (XANAX) 1 MG tablet as directed.  0  . amphetamine-dextroamphetamine (ADDERALL, 30MG ,) 30 MG tablet Take 30 mg by mouth 2 (two) times daily.      . chlorpheniramine-HYDROcodone (TUSSIONEX PENNKINETIC ER) 10-8 MG/5ML SUER Take 5 mLs by mouth every 12 (twelve) hours as needed for cough. 140 mL 0  . D3-50 50000 units capsule Take 1 tablet by mouth once a week.  5  . eszopiclone (LUNESTA) 1 MG TABS tablet Take 1 mg by mouth at bedtime as needed for sleep. Take immediately  before bedtime    . ibuprofen (ADVIL) 800 MG tablet Take 1 tablet (800 mg total) by mouth every 8 (eight) hours as needed. 90 tablet 4  . pantoprazole (PROTONIX) 40 MG tablet Take 1 tablet (40 mg total) by mouth 2 (two) times daily. 180 tablet 3  . promethazine (PHENERGAN) 25 MG tablet TAKE 1 TABLET BY MOUTH EVERY 8 HOURS AS NEEDED FOR NAUSEA FOR VOMITING 20 tablet 0  . triamcinolone cream (KENALOG) 0.1 % Apply 1 application topically 2 (two) times daily. 30 g 0  . valACYclovir (VALTREX) 500 MG tablet Take 1 tablet (500 mg total) by mouth 2 (two) times daily. At onset of outbreak for 5 days 30 tablet 6   No current facility-administered medications on file prior to visit.   Allergies  Allergen Reactions  . Doxycycline Nausea And Vomiting  . Macrobid [Nitrofurantoin Macrocrystal] Nausea And Vomiting  . Penicillins     Got real sick   Social History   Socioeconomic History  . Marital status: Single    Spouse name: Not on file  . Number of children: Not on file  . Years of education: Not on file  . Highest education level: Not on file  Occupational History  . Not on file  Tobacco Use  . Smoking status: Never Smoker  . Smokeless tobacco: Never Used  Substance and Sexual Activity  .  Alcohol use: Yes    Alcohol/week: 0.0 standard drinks    Comment: Occas wine  . Drug use: No  . Sexual activity: Not Currently    Comment: 1st intercourse 49 yo-Fewer than 5 partners  Other Topics Concern  . Not on file  Social History Narrative  . Not on file   Social Determinants of Health   Financial Resource Strain:   . Difficulty of Paying Living Expenses:   Food Insecurity:   . Worried About Charity fundraiser in the Last Year:   . Arboriculturist in the Last Year:   Transportation Needs:   . Film/video editor (Medical):   Marland Kitchen Lack of Transportation (Non-Medical):   Physical Activity:   . Days of Exercise per Week:   . Minutes of Exercise per Session:   Stress:   . Feeling of  Stress :   Social Connections:   . Frequency of Communication with Friends and Family:   . Frequency of Social Gatherings with Friends and Family:   . Attends Religious Services:   . Active Member of Clubs or Organizations:   . Attends Archivist Meetings:   Marland Kitchen Marital Status:   Intimate Partner Violence:   . Fear of Current or Ex-Partner:   . Emotionally Abused:   Marland Kitchen Physically Abused:   . Sexually Abused:      Review of Systems  All other systems reviewed and are negative.      Objective:   Physical Exam Vitals reviewed.  Constitutional:      Appearance: Normal appearance.  HENT:     Right Ear: Tympanic membrane and ear canal normal.     Left Ear: Tympanic membrane and ear canal normal.     Nose: No congestion or rhinorrhea.     Mouth/Throat:     Mouth: Mucous membranes are moist.     Pharynx: Oropharynx is clear. No oropharyngeal exudate.  Cardiovascular:     Rate and Rhythm: Normal rate and regular rhythm.     Heart sounds: Normal heart sounds.  Pulmonary:     Effort: Pulmonary effort is normal. No respiratory distress.     Breath sounds: Decreased breath sounds, wheezing and rales present.    Abdominal:     General: Abdomen is flat. Bowel sounds are normal. There is no distension.     Palpations: Abdomen is soft.     Tenderness: There is no abdominal tenderness.  Neurological:     Mental Status: She is alert.           Assessment & Plan:  Cough - Plan: DG Chest 2 View, CBC with Differential/Platelet, COMPLETE METABOLIC PANEL WITH GFR, Sedimentation rate, D-dimer, quantitative (not at [facility])  Obtain chest x-ray to evaluate for any evidence of right lower lobe pneumonia.  Also check sedimentation rate as well as CBC and CMP given her pleurisy.  If chest x-ray and lab work is normal, would likely treat the patient with an inhaler for possible reactive airway disease in addition to Tussionex 1 teaspoon every 12 hours as needed for cough.  If the  patient has community-acquired pneumonia on chest x-ray, she would likely need Augmentin for 10 days in addition to the inhaler.  If the D-dimer is elevated I would recommend a CT scan of the lung.

## 2019-07-04 ENCOUNTER — Other Ambulatory Visit: Payer: Self-pay | Admitting: Family Medicine

## 2019-07-04 LAB — COMPLETE METABOLIC PANEL WITH GFR
AG Ratio: 1.4 (calc) (ref 1.0–2.5)
ALT: 19 U/L (ref 6–29)
AST: 20 U/L (ref 10–35)
Albumin: 4 g/dL (ref 3.6–5.1)
Alkaline phosphatase (APISO): 73 U/L (ref 37–153)
BUN: 16 mg/dL (ref 7–25)
CO2: 28 mmol/L (ref 20–32)
Calcium: 9.6 mg/dL (ref 8.6–10.4)
Chloride: 103 mmol/L (ref 98–110)
Creat: 0.86 mg/dL (ref 0.50–1.05)
GFR, Est African American: 91 mL/min/{1.73_m2} (ref >=60)
GFR, Est Non African American: 78 mL/min/{1.73_m2} (ref >=60)
Globulin: 2.8 g/dL (calc) (ref 1.9–3.7)
Glucose, Bld: 79 mg/dL (ref 65–99)
Potassium: 4.3 mmol/L (ref 3.5–5.3)
Sodium: 139 mmol/L (ref 135–146)
Total Bilirubin: 0.5 mg/dL (ref 0.2–1.2)
Total Protein: 6.8 g/dL (ref 6.1–8.1)

## 2019-07-04 LAB — CBC WITH DIFFERENTIAL/PLATELET
Absolute Monocytes: 651 cells/uL (ref 200–950)
Basophils Absolute: 70 cells/uL (ref 0–200)
Basophils Relative: 1 %
Eosinophils Absolute: 182 cells/uL (ref 15–500)
Eosinophils Relative: 2.6 %
HCT: 44.5 % (ref 35.0–45.0)
Hemoglobin: 15 g/dL (ref 11.7–15.5)
Lymphs Abs: 3031 cells/uL (ref 850–3900)
MCH: 29.9 pg (ref 27.0–33.0)
MCHC: 33.7 g/dL (ref 32.0–36.0)
MCV: 88.8 fL (ref 80.0–100.0)
MPV: 10.8 fL (ref 7.5–12.5)
Monocytes Relative: 9.3 %
Neutro Abs: 3066 cells/uL (ref 1500–7800)
Neutrophils Relative %: 43.8 %
Platelets: 303 10*3/uL (ref 140–400)
RBC: 5.01 10*6/uL (ref 3.80–5.10)
RDW: 13.2 % (ref 11.0–15.0)
Total Lymphocyte: 43.3 %
WBC: 7 10*3/uL (ref 3.8–10.8)

## 2019-07-04 LAB — D-DIMER, QUANTITATIVE: D-Dimer, Quant: 0.24 mcg/mL FEU (ref <0.50)

## 2019-07-04 LAB — SEDIMENTATION RATE: Sed Rate: 2 mm/h (ref 0–30)

## 2019-07-04 MED ORDER — BUDESONIDE-FORMOTEROL FUMARATE 160-4.5 MCG/ACT IN AERO: 2.0000 | INHALATION_SPRAY | Freq: Two times a day (BID) | RESPIRATORY_TRACT | 3 refills | Status: DC

## 2019-07-04 MED ORDER — TRAMADOL HCL 50 MG PO TABS: 50.0000 mg | ORAL_TABLET | Freq: Two times a day (BID) | ORAL | 0 refills | Status: AC

## 2019-07-04 NOTE — Telephone Encounter (Signed)
Please send over Tramadol - filled for 2 weeks - change if you want to give her more

## 2019-07-14 ENCOUNTER — Telehealth: Payer: Self-pay | Admitting: Family Medicine

## 2019-07-14 NOTE — Telephone Encounter (Signed)
Pt called and states that she has fluid behind her left ear drum. She is still coughing some but better. She would like something for the fluid behind her ear drum before it turns into an infection.

## 2019-07-17 ENCOUNTER — Other Ambulatory Visit: Payer: Self-pay | Admitting: Family Medicine

## 2019-07-17 NOTE — Telephone Encounter (Signed)
Pt aware and states that she has been taking nasonex and she can not take flonase. Pt is still sick. Recommended she come in - pt agreed and apt made.

## 2019-07-17 NOTE — Telephone Encounter (Signed)
Fluid behind ear drum use xyzal 5 mg poqday and flonase 2 sprays each nostril daily.

## 2019-07-18 ENCOUNTER — Encounter: Payer: Self-pay | Admitting: Family Medicine

## 2019-07-18 ENCOUNTER — Other Ambulatory Visit: Payer: Self-pay

## 2019-07-18 ENCOUNTER — Ambulatory Visit (INDEPENDENT_AMBULATORY_CARE_PROVIDER_SITE_OTHER): Payer: BC Managed Care – PPO | Admitting: Family Medicine

## 2019-07-18 VITALS — BP 140/76 | HR 84 | Temp 97.2°F | Resp 16 | Ht 66.0 in | Wt 238.0 lb

## 2019-07-18 DIAGNOSIS — R05 Cough: Secondary | ICD-10-CM | POA: Diagnosis not present

## 2019-07-18 DIAGNOSIS — R059 Cough, unspecified: Secondary | ICD-10-CM

## 2019-07-18 MED ORDER — ALBUTEROL SULFATE HFA 108 (90 BASE) MCG/ACT IN AERS: 2.0000 | INHALATION_SPRAY | Freq: Four times a day (QID) | RESPIRATORY_TRACT | 0 refills | Status: DC | PRN

## 2019-07-18 MED ORDER — AMOXICILLIN-POT CLAVULANATE ER 1000-62.5 MG PO TB12: 2.0000 | ORAL_TABLET | Freq: Two times a day (BID) | ORAL | 0 refills | Status: DC

## 2019-07-18 NOTE — Progress Notes (Signed)
Subjective:    Patient ID: Catherine Rodriguez, female    DOB: 1970/10/18, 49 y.o.   MRN: [id number]  HPI 07/03/19 Patient is a 49 year old Caucasian female who developed a cough approximately 8 weeks ago.  The cough occurred shortly after she received her second Covid vaccination.  She developed runny nose, postnasal drip, head congestion, and shortness of breath.  She was initially put on a Z-Pak and Hycodan.  This provided her no relief.  She was recently seen by my PA as a telephone visit and was started on prednisone in addition to [place].  She continues to have a persistent nonproductive cough.  She also reports congestion in her chest, she now has bilateral bibasilar pleurisy.  She denies any hemoptysis.  She denies any angina.  She denies any nausea or vomiting.  She denies any heartburn or postnasal drip or sinus pain.  On physical exam today she does have diminished breath sounds bilaterally with faint expiratory wheezing.  She also has right basilar crackles.  At that time, my plan was: Obtain chest x-ray to evaluate for any evidence of right lower lobe pneumonia.  Also check sedimentation rate as well as CBC and CMP given her pleurisy.  If chest x-ray and lab work is normal, would likely treat the patient with an inhaler for possible reactive airway disease in addition to Tussionex 1 teaspoon every 12 hours as needed for cough.  If the patient has community-acquired pneumonia on chest x-ray, she would likely need Augmentin for 10 days in addition to the inhaler.  If the D-dimer is elevated I would recommend a CT scan of the lung.  Labs and CXR were normal.  Therefore, my advice to the patient was: "[place] news, chest x-ray is completely clear. There is no pneumonia or fluid seen. The crackles I heard must be mucus in the airways. Her labs came back completely normal suggesting no evidence of blood clot or autoimmune disease. I believe she likely is having reactive airway disease. I would  recommend starting Symbicort 160/4.5, 2 puffs inhaled twice daily. She may also have nerve irritation in her throat causing the coughing fits. She can use tramadol 50 mg twice daily for cough. This is a pain medication however he can help stop chronic coughing. Use the Hycodan sparingly. Call back with an update in 1 week."  07/18/19 Cough has now been present for 10 weeks.  Patient has seen no improvement since starting the Symbicort.  Yesterday she was playing with her dog outside and became extremely winded and had to stop and was wheezing.  She required her rescue inhaler.  She continues to have a cough on a daily basis.  The cough is primarily during the daytime.  After she takes her Johnnye Sima she is able to sleep at night.  She has not been using the tramadol or even the cough medication we gave her as she is trying to avoid.  She does have some fluid behind her left eardrum per her report although there is none visible today on exam.  She denies any rhinorrhea.  She has no postnasal drip.  She denies any indigestion.  She is already on Protonix.  She denies any hemoptysis.  However the cough is now productive of green thick purulent sputum per her report.  She also reports chest congestion.  On her exam today she continues to have right basilar crackles and rhonchorous breath sounds.  There is no audible wheezing. Past Medical History:  Diagnosis Date  .  ASCUS with positive high risk HPV cervical 2013   normal colposcopy  . GERD (gastroesophageal reflux disease)   . Herpes simplex   . IBS (irritable bowel syndrome)    Past Surgical History:  Procedure Laterality Date  . APPENDECTOMY  1993  . eyellid  2010  . THERAPEUTIC ABORTION     X 2  . tubes in ears     as a child   Current Outpatient Medications on File Prior to Visit  Medication Sig Dispense Refill  . ALPRAZolam (XANAX) 1 MG tablet as directed.  0  . amphetamine-dextroamphetamine (ADDERALL, 30MG ,) 30 MG tablet Take 30 mg by  mouth 2 (two) times daily.      . budesonide-formoterol (SYMBICORT) 160-4.5 MCG/ACT inhaler Inhale 2 puffs into the lungs 2 (two) times daily. 1 Inhaler 3  . chlorpheniramine-HYDROcodone (TUSSIONEX PENNKINETIC ER) 10-8 MG/5ML SUER Take 5 mLs by mouth every 12 (twelve) hours as needed for cough. 140 mL 0  . chlorpheniramine-HYDROcodone (TUSSIONEX PENNKINETIC ER) 10-8 MG/5ML SUER Take 5 mLs by mouth every 12 (twelve) hours as needed for cough. 140 mL 0  . D3-50 50000 units capsule Take 1 tablet by mouth once a week.  5  . eszopiclone (LUNESTA) 1 MG TABS tablet Take 1 mg by mouth at bedtime as needed for sleep. Take immediately before bedtime    . ibuprofen (ADVIL) 800 MG tablet Take 1 tablet (800 mg total) by mouth every 8 (eight) hours as needed. 90 tablet 4  . pantoprazole (PROTONIX) 40 MG tablet Take 1 tablet (40 mg total) by mouth 2 (two) times daily. 180 tablet 3  . promethazine (PHENERGAN) 25 MG tablet TAKE 1 TABLET BY MOUTH EVERY 8 HOURS AS NEEDED FOR NAUSEA FOR VOMITING 20 tablet 0  . traMADol (ULTRAM) 50 MG tablet Take 1 tablet (50 mg total) by mouth 2 (two) times daily for 14 days. 28 tablet 0  . triamcinolone cream (KENALOG) 0.1 % Apply 1 application topically 2 (two) times daily. 30 g 0  . valACYclovir (VALTREX) 500 MG tablet Take 1 tablet (500 mg total) by mouth 2 (two) times daily. At onset of outbreak for 5 days 30 tablet 6   No current facility-administered medications on file prior to visit.   Allergies  Allergen Reactions  . Doxycycline Nausea And Vomiting  . Macrobid [Nitrofurantoin Macrocrystal] Nausea And Vomiting  . Penicillins     Got real sick   Social History   Socioeconomic History  . Marital status: Single    Spouse name: Not on file  . Number of children: Not on file  . Years of education: Not on file  . Highest education level: Not on file  Occupational History  . Not on file  Tobacco Use  . Smoking status: Never Smoker  . Smokeless tobacco: Never Used    Substance and Sexual Activity  . Alcohol use: Yes    Alcohol/week: 0.0 standard drinks    Comment: Occas wine  . Drug use: No  . Sexual activity: Not Currently    Comment: 1st intercourse 49 yo-Fewer than 5 partners  Other Topics Concern  . Not on file  Social History Narrative  . Not on file   Social Determinants of Health   Financial Resource Strain:   . Difficulty of Paying Living Expenses:   Food Insecurity:   . Worried About Charity fundraiser in the Last Year:   . Arboriculturist in the Last Year:   Transportation Needs:   .  Lack of Transportation (Medical):   Marland Kitchen Lack of Transportation (Non-Medical):   Physical Activity:   . Days of Exercise per Week:   . Minutes of Exercise per Session:   Stress:   . Feeling of Stress :   Social Connections:   . Frequency of Communication with Friends and Family:   . Frequency of Social Gatherings with Friends and Family:   . Attends Religious Services:   . Active Member of Clubs or Organizations:   . Attends Archivist Meetings:   Marland Kitchen Marital Status:   Intimate Partner Violence:   . Fear of Current or Ex-Partner:   . Emotionally Abused:   Marland Kitchen Physically Abused:   . Sexually Abused:      Review of Systems  All other systems reviewed and are negative.      Objective:   Physical Exam Vitals reviewed.  Constitutional:      Appearance: Normal appearance.  HENT:     Right Ear: Tympanic membrane and ear canal normal.     Left Ear: Tympanic membrane and ear canal normal.     Nose: No congestion or rhinorrhea.     Mouth/Throat:     Mouth: Mucous membranes are moist.     Pharynx: Oropharynx is clear. No oropharyngeal exudate.  Cardiovascular:     Rate and Rhythm: Normal rate and regular rhythm.     Heart sounds: Normal heart sounds.  Pulmonary:     Effort: Pulmonary effort is normal. No respiratory distress.     Breath sounds: Decreased breath sounds, rhonchi and rales present. No wheezing.    Abdominal:      General: Abdomen is flat. Bowel sounds are normal. There is no distension.     Palpations: Abdomen is soft.     Tenderness: There is no abdominal tenderness.  Neurological:     Mental Status: She is alert.           Assessment & Plan:  Cough  Patient has abnormal breath sounds.  I will treat the patient for community-acquired pneumonia with Augmentin extended release 2000 mg twice daily for 7 days.  This would also cover subacute sinusitis with postnasal drip potentially causing cough.  She is already on Nasonex and Protonix for possible postnasal drip as well as laryngoesophageal reflux.  I am concerned about her abnormal breath sounds in the right base despite normal chest x-ray.  If cough persists, I would recommend a CT scan of the lungs.  I discussed this with the patient in detail.  She will call me back if the cough is no better in 1 week.  She will also use the tramadol for possible nerve irritation in the throat causing cough.  Recheck in 1 week.  CT scan if cough is not improving.

## 2019-07-20 ENCOUNTER — Telehealth: Payer: Self-pay | Admitting: Family Medicine

## 2019-07-20 ENCOUNTER — Other Ambulatory Visit: Payer: Self-pay | Admitting: *Deleted

## 2019-07-20 DIAGNOSIS — R059 Cough, unspecified: Secondary | ICD-10-CM

## 2019-07-20 DIAGNOSIS — R0989 Other specified symptoms and signs involving the circulatory and respiratory systems: Secondary | ICD-10-CM

## 2019-07-20 DIAGNOSIS — R05 Cough: Secondary | ICD-10-CM

## 2019-07-20 NOTE — Telephone Encounter (Signed)
Tuesday-was seen for cough.  Does she want ct of lungs because of cough or ct of sinuses because of nose bleed?  I discussed CT of lungs with contrast due to chronic cough and right basilar crackles despite normal cxr.  I am ok ordering that.  Would not do CT of sinuses for nose bleed

## 2019-07-20 NOTE — Telephone Encounter (Signed)
Call placed to patient.   Reports that cough is persistent, but has improved. Reports that crackles remain in R lung base. Requesting CT of  Chest. Orders placed.   Reports that she had nosebleed last night. Reports that there was blood clots noted after she blew her nose. Advised that after bleed, this is normal as the blood will clot to stop blood loss.   Advised to use nasal saline to keep mucus membranes moist.   Advised if she has another bleed that will not stop, go to UC or ER for evaluation.

## 2019-07-20 NOTE — Telephone Encounter (Signed)
MD please advise

## 2019-07-20 NOTE — Telephone Encounter (Signed)
Patient was seen on Monday, she is still having issues along with a nose bleed  Would like to set up a ct scan if possible  [phone]

## 2019-07-27 ENCOUNTER — Inpatient Hospital Stay: Admission: RE | Admit: 2019-07-27 | Payer: BC Managed Care – PPO | Source: Ambulatory Visit

## 2019-08-08 ENCOUNTER — Other Ambulatory Visit: Payer: Self-pay

## 2019-08-08 ENCOUNTER — Encounter: Payer: Self-pay | Admitting: Allergy and Immunology

## 2019-08-08 ENCOUNTER — Ambulatory Visit: Payer: BC Managed Care – PPO | Admitting: Allergy and Immunology

## 2019-08-08 ENCOUNTER — Telehealth: Payer: Self-pay

## 2019-08-08 VITALS — BP 124/82 | HR 82 | Temp 98.7°F | Resp 16 | Ht 66.0 in | Wt 239.0 lb

## 2019-08-08 DIAGNOSIS — J4540 Moderate persistent asthma, uncomplicated: Secondary | ICD-10-CM

## 2019-08-08 DIAGNOSIS — K219 Gastro-esophageal reflux disease without esophagitis: Secondary | ICD-10-CM | POA: Diagnosis not present

## 2019-08-08 DIAGNOSIS — J3089 Other allergic rhinitis: Secondary | ICD-10-CM

## 2019-08-08 MED ORDER — FAMOTIDINE 40 MG PO TABS: ORAL_TABLET | ORAL | 5 refills | Status: DC

## 2019-08-08 MED ORDER — ALBUTEROL SULFATE HFA 108 (90 BASE) MCG/ACT IN AERS: 2.0000 | INHALATION_SPRAY | Freq: Four times a day (QID) | RESPIRATORY_TRACT | 1 refills | Status: DC | PRN

## 2019-08-08 MED ORDER — MOMETASONE FUROATE 50 MCG/ACT NA SUSP: NASAL | 5 refills | Status: DC

## 2019-08-08 NOTE — Telephone Encounter (Signed)
Dr. Neldon Mc would like this patient to have a referral to ENT for Laryngopharyngeal reflux.

## 2019-08-08 NOTE — Progress Notes (Signed)
[facility] [place] - [place] - [place] - [place]   Dear Dr. Dennard Schaumann,  Thank you for referring [name] to the [facility] on 08/08/2019.   Below is a summation of this patient's evaluation and recommendations.  Thank you for your referral. I will keep you informed about this patient's response to treatment.   If you have any questions please do not hesitate to contact me.   Sincerely,  Jiles Prows, MD Allergy / Immunology [facility]   ______________________________________________________________________    NEW PATIENT NOTE  Referring Provider: Susy Frizzle, MD Primary Provider: Susy Frizzle, MD Date of office visit: 08/08/2019    Subjective:   Chief Complaint:  Catherine Rodriguez (DOB: 05/23/1970) is a 49 y.o. female who presents to the clinic on 08/08/2019 with a chief complaint of Asthma .     HPI: Catherine Rodriguez presents to this clinic in evaluation of cough.  I had apparently seen her in this clinic over 6 years ago for an issue with perioral dermatitis and allergic and nonallergic rhinitis which apparently is not a particularly big issue at this point while using some nasal steroids.  On 27 May 2019 she started to develop an unrelenting cough associated with raspy voice and laryngitis and mucus production with posttussive micturition and sleep disturbance that has been treated with multiple antibiotics including azithromycin and levofloxacin and at least 1 course of prednisone and starting on various inhalers and albuterol which has not really helped her very much.  Currently she still has cough and raspy voice and she has a change in her voice that has never resolved and she occasionally gets some sternal pain with her coughing.  Most of her mucus production appears to have resolved.  Her coughing is not as severe as it was in the past and that she can  sleep at nighttime.  She has never really developed any significant upper airway symptoms and she can smell and taste with no problem.  She does have a history of reflux treated with pantoprazole twice a day which she thinks is under good control.  She has been treated with 2 Covid vaccinations.  Past Medical History:  Diagnosis Date  . ASCUS with positive high risk HPV cervical 2013   normal colposcopy  . Asthma   . GERD (gastroesophageal reflux disease)   . Herpes simplex   . IBS (irritable bowel syndrome)     Past Surgical History:  Procedure Laterality Date  . APPENDECTOMY  1993  . eyellid  2010  . THERAPEUTIC ABORTION     X 2  . tubes in ears     as a child    Allergies as of 08/08/2019      Reactions   Doxycycline Nausea And Vomiting   Macrobid [nitrofurantoin Macrocrystal] Nausea And Vomiting   Penicillins    Got real sick      Medication List      Adderall 30 MG tablet Generic drug: amphetamine-dextroamphetamine Take 30 mg by mouth 2 (two) times daily.   albuterol 108 (90 Base) MCG/ACT inhaler Commonly known as: VENTOLIN HFA Inhale 2 puffs into the lungs every 6 (six) hours as needed for wheezing or shortness of breath.   ALPRAZolam 1 MG tablet Commonly known as: XANAX as directed.   budesonide-formoterol 160-4.5 MCG/ACT inhaler Commonly known as: SYMBICORT Inhale 2 puffs into the lungs 2 (two) times daily.  chlorpheniramine-HYDROcodone 10-8 MG/5ML Suer Commonly known as: Tussionex Pennkinetic ER Take 5 mLs by mouth every 12 (twelve) hours as needed for cough.   D3-50 1.25 MG (50000 UT) capsule Generic drug: Cholecalciferol Take 1 tablet by mouth once a week.   eszopiclone 1 MG Tabs tablet Commonly known as: LUNESTA Take 1 mg by mouth at bedtime as needed for sleep. Take immediately before bedtime   ibuprofen 800 MG tablet Commonly known as: ADVIL Take 1 tablet (800 mg total) by mouth every 8 (eight) hours as needed.   meloxicam 15 MG  tablet Commonly known as: MOBIC Take 15 mg by mouth every other day.   pantoprazole 40 MG tablet Commonly known as: PROTONIX Take 1 tablet (40 mg total) by mouth 2 (two) times daily.   traMADol 50 MG tablet Commonly known as: ULTRAM Take 50 mg by mouth 2 (two) times daily.   triamcinolone cream 0.1 % Commonly known as: KENALOG Apply 1 application topically 2 (two) times daily.   valACYclovir 500 MG tablet Commonly known as: VALTREX Take 1 tablet (500 mg total) by mouth 2 (two) times daily. At onset of outbreak for 5 days       Review of systems negative except as noted in HPI / PMHx or noted below:  Review of Systems  Constitutional: Negative.   HENT: Negative.   Eyes: Negative.   Respiratory: Negative.   Cardiovascular: Negative.   Gastrointestinal: Negative.   Genitourinary: Negative.   Musculoskeletal: Negative.   Skin: Negative.   Neurological: Negative.   Endo/Heme/Allergies: Negative.   Psychiatric/Behavioral: Negative.     Family History  Problem Relation Age of Onset  . Breast cancer Sister 66  . Hypertension Father   . Diabetes Paternal Grandmother   . Asthma Mother     Social History   Socioeconomic History  . Marital status: Single    Spouse name: Not on file  . Number of children: Not on file  . Years of education: Not on file  . Highest education level: Not on file  Occupational History  . Not on file  Tobacco Use  . Smoking status: Never Smoker  . Smokeless tobacco: Never Used  Substance and Sexual Activity  . Alcohol use: Yes    Alcohol/week: 0.0 standard drinks    Comment: Occas wine  . Drug use: No  . Sexual activity: Not Currently    Comment: 1st intercourse 49 yo-Fewer than 5 partners  Other Topics Concern  . Not on file  Social History Narrative  . Not on file    Environmental and Social history  Lives in a house with a dry environment, no animals located inside the household, no carpet in the bedroom, no plastic on the  bed, no plastic on the pillow, no smoking ongoing with inside the household.  Objective:   Vitals:   08/08/19 1421  BP: 124/82  Pulse: 82  Resp: 16  Temp: 98.7 F (37.1 C)  SpO2: 97%   Height: 5\' 6"  (167.6 cm) Weight: 239 lb (108.4 kg)  Physical Exam Constitutional:      Appearance: She is not diaphoretic.  HENT:     Head: Normocephalic.     Right Ear: Tympanic membrane, ear canal and external ear normal.     Left Ear: Tympanic membrane, ear canal and external ear normal.     Nose: Nose normal. No mucosal edema or rhinorrhea.     Mouth/Throat:     Pharynx: Uvula midline. No oropharyngeal exudate.  Eyes:  Conjunctiva/sclera: Conjunctivae normal.  Neck:     Thyroid: No thyromegaly.     Trachea: Trachea normal. No tracheal tenderness or tracheal deviation.  Cardiovascular:     Rate and Rhythm: Normal rate and regular rhythm.     Heart sounds: Normal heart sounds, S1 normal and S2 normal. No murmur.  Pulmonary:     Effort: No respiratory distress.     Breath sounds: Normal breath sounds. No stridor. No wheezing or rales.  Lymphadenopathy:     Head:     Right side of head: No tonsillar adenopathy.     Left side of head: No tonsillar adenopathy.     Cervical: No cervical adenopathy.  Skin:    Findings: No erythema or rash.     Nails: There is no clubbing.  Neurological:     Mental Status: She is alert.     Diagnostics: Allergy skin tests were not performed.   Spirometry was performed and demonstrated an FEV1 of 1.42 @ 47 % of predicted. FEV1/FVC = 0.50.  She had very early truncation of her expiratory loop.  Assessment and Plan:    1. Not well controlled moderate persistent asthma   2. LPRD (laryngopharyngeal reflux disease)   3. Perennial allergic rhinitis     1.  Treat and prevent reflux / LPR:   A.  Pantoprazole 40 mg -1 tablet twice a day  B.  Famotidine 40 mg -1 tablet in evening  C.  Consolidate all forms of caffeine consumption slowly  2.   Evaluation of throat with ENT for LPR  3.  Treat and prevent inflammation:   A. Symbicort 160 - 2 inhalations 2 times per day with spacer  B. Nasonex - 1 spray each nostril 2 times per day  4. If needed:   A. Albuterol HFA - 2 inhalations evry 4-6 hours  B. OTC antihistamine  5. Return to clinic in 4 weeks or earlier if problem  It appears that Klaira has 2 main problems giving rise to her cough.  I suspect that there was some type of viral or atypical bacterial infection giving rise to her initial insult with unrelenting coughing.  Some of these infectious diseases can give rise to coughing up to 12 weeks after onset of symptoms.  Certainly at this point in time there does not appear to be any active infection that requires additional therapy above and beyond her azithromycin and Levaquin that she has received previously.  She will continue to use anti-inflammatory agents for any inflammatory component of her airway issue.  I think her LPR has flared in conjunction with this coughing and we will treat her with the therapy noted above.  She does have a early truncation of her expiratory flow loop on spirometry and I think it be worthwhile to have her throat evaluated by ENT to make sure were not dealing with anything other than LPR contributing to some of her symptomatology.  I will regroup with her in 4 weeks to assess her response to this approach.  Jiles Prows, MD Allergy / Immunology [facility]

## 2019-08-08 NOTE — Telephone Encounter (Signed)
Dr Janace Hoard is retired. Please refer to [facility]. Wilburn Cornelia, MD per Dr. Neldon Mc. He is in the same practice.

## 2019-08-08 NOTE — Telephone Encounter (Signed)
She is currently seeing Dr. Melissa Montane at [facility].

## 2019-08-08 NOTE — Patient Instructions (Addendum)
  1.  Treat and prevent reflux / LPR:   A.  Pantoprazole 40 mg -1 tablet twice a day  B.  Famotidine 40 mg -1 tablet in evening  C.  Consolidate all forms of caffeine consumption slowly  2.  Evaluation of throat with ENT for LPR  3.  Treat and prevent inflammation:   A. Symbicort 160 - 2 inhalations 2 times per day with spacer  B. Nasonex - 1 spray each nostril 2 times per day  4. If needed:   A. Albuterol HFA - 2 inhalations evry 4-6 hours  B. OTC antihistamine  5. Return to clinic in 4 weeks or earlier if problem

## 2019-08-09 ENCOUNTER — Encounter: Payer: Self-pay | Admitting: Allergy and Immunology

## 2019-08-14 NOTE — Telephone Encounter (Signed)
Referral has been faxed to Dr Danie Binder office for review & scheduling.   Thanks

## 2019-08-15 ENCOUNTER — Ambulatory Visit: Payer: BC Managed Care – PPO | Admitting: Family Medicine

## 2019-08-24 ENCOUNTER — Telehealth: Payer: Self-pay | Admitting: Allergy and Immunology

## 2019-08-24 NOTE — Telephone Encounter (Signed)
Patient called and states she has continued her Symbicort as directed, but it has caused her to lose most of her taste and smell. Patient would like to know what can be done about it.  Please advise.

## 2019-08-24 NOTE — Telephone Encounter (Signed)
Patient called and states that she has not heard from ENT regarding her referral. Patient informed referral was sent 5/24 and was given Dr. Danie Binder office number.

## 2019-08-24 NOTE — Telephone Encounter (Signed)
Please advice  

## 2019-08-24 NOTE — Telephone Encounter (Signed)
Lets have her stop the nasal steroid and see if her sense of smell and taste comes back.  Did she get an appointment with ENT yet?

## 2019-08-25 NOTE — Telephone Encounter (Signed)
Unable to reach patient. Left voicemail for patient to return call

## 2019-08-28 DIAGNOSIS — R053 Chronic cough: Secondary | ICD-10-CM | POA: Insufficient documentation

## 2019-08-28 DIAGNOSIS — K219 Gastro-esophageal reflux disease without esophagitis: Secondary | ICD-10-CM | POA: Insufficient documentation

## 2019-08-28 DIAGNOSIS — R490 Dysphonia: Secondary | ICD-10-CM | POA: Insufficient documentation

## 2019-08-30 NOTE — Telephone Encounter (Signed)
Spoke with patient and she stated that she has been taking her nasal spray for years with no problems. She stated that she didn't have problems until she started back on the Symbicort and is unsure if it is that or the mixture of both the nasal spray and Symbicort. I informed patient to stop the nasal spray to see if it helps. Patient stated that she will stop the nasal spray for a few days but does not feeling like that is the reason. She did go to the ENT Monday 08/28/2019.

## 2019-09-04 ENCOUNTER — Other Ambulatory Visit: Payer: Self-pay

## 2019-09-05 ENCOUNTER — Ambulatory Visit: Payer: BC Managed Care – PPO | Admitting: Allergy and Immunology

## 2019-09-05 ENCOUNTER — Other Ambulatory Visit: Payer: Self-pay

## 2019-09-05 ENCOUNTER — Encounter: Payer: Self-pay | Admitting: Allergy and Immunology

## 2019-09-05 VITALS — BP 116/72 | HR 74 | Resp 18 | Ht 66.0 in

## 2019-09-05 DIAGNOSIS — K219 Gastro-esophageal reflux disease without esophagitis: Secondary | ICD-10-CM

## 2019-09-05 DIAGNOSIS — R430 Anosmia: Secondary | ICD-10-CM | POA: Diagnosis not present

## 2019-09-05 DIAGNOSIS — J3089 Other allergic rhinitis: Secondary | ICD-10-CM

## 2019-09-05 DIAGNOSIS — J4540 Moderate persistent asthma, uncomplicated: Secondary | ICD-10-CM

## 2019-09-05 MED ORDER — ALVESCO 80 MCG/ACT IN AERS: INHALATION_SPRAY | RESPIRATORY_TRACT | 3 refills | Status: DC

## 2019-09-05 NOTE — Progress Notes (Signed)
[facility] [facility] [place] [facility] [place]   Follow-up Note  Referring Provider: Susy Frizzle, MD Primary Provider: Susy Frizzle, MD Date of Office Visit: 09/05/2019  Subjective:   Catherine Rodriguez (DOB: 11-Nov-1970) is a 49 y.o. female who returns to the [facility] on 09/05/2019 in re-evaluation of the following:  HPI: Shyah returns to this clinic in evaluation of asthma and allergic rhinitis and LPR.  I last saw her in this clinic during her initial evaluation of 08 Aug 2019.  She is doing much better at this point in time and has basically resolved her cough.  Her throat also feels a lot better and she has very little issues with raspy voice and throat clearing.  She has had no issues with her nose.  However, she cannot smell or taste.  Apparently she lost that ability a few days after visiting with me in this clinic.  She thinks that she may have had a problem with Symbicort in the past causing this problem.  She did visit with Dr. Wilburn Cornelia, ENT, who documented a swollen throat.  She has not been having any classic reflux symptoms.  She has eliminated almost all caffeine consumption.  Allergies as of 09/05/2019      Reactions   Doxycycline Nausea And Vomiting   Macrobid [nitrofurantoin Macrocrystal] Nausea And Vomiting   Penicillins    Got real sick      Medication List      Adderall 30 MG tablet Generic drug: amphetamine-dextroamphetamine Take 30 mg by mouth 2 (two) times daily.   albuterol 108 (90 Base) MCG/ACT inhaler Commonly known as: VENTOLIN HFA Inhale 2 puffs into the lungs every 6 (six) hours as needed for wheezing or shortness of breath.   ALPRAZolam 1 MG tablet Commonly known as: XANAX as directed.   budesonide-formoterol 160-4.5 MCG/ACT inhaler Commonly known as: SYMBICORT Inhale 2 puffs into the lungs 2 (two) times daily.   chlorpheniramine-HYDROcodone 10-8 MG/5ML Suer Commonly known as: Tussionex  Pennkinetic ER Take 5 mLs by mouth every 12 (twelve) hours as needed for cough.   D3-50 1.25 MG (50000 UT) capsule Generic drug: Cholecalciferol Take 1 tablet by mouth once a week.   eszopiclone 1 MG Tabs tablet Commonly known as: LUNESTA Take 1 mg by mouth at bedtime as needed for sleep. Take immediately before bedtime   famotidine 40 MG tablet Commonly known as: PEPCID Take 1 tablet in the evening   ibuprofen 800 MG tablet Commonly known as: ADVIL Take 1 tablet (800 mg total) by mouth every 8 (eight) hours as needed.   meloxicam 15 MG tablet Commonly known as: MOBIC Take 15 mg by mouth every other day.   mometasone 50 MCG/ACT nasal spray Commonly known as: Nasonex 1 spray each nostril 2 times per day   pantoprazole 40 MG tablet Commonly known as: PROTONIX Take 1 tablet (40 mg total) by mouth 2 (two) times daily.   promethazine 25 MG tablet Commonly known as: PHENERGAN Take by mouth.   traMADol 50 MG tablet Commonly known as: ULTRAM Take 50 mg by mouth 2 (two) times daily.   triamcinolone cream 0.1 % Commonly known as: KENALOG Apply 1 application topically 2 (two) times daily.   valACYclovir 500 MG tablet Commonly known as: VALTREX Take 1 tablet (500 mg total) by mouth 2 (two) times daily. At onset of outbreak for 5 days       Past Medical History:  Diagnosis Date  .  ASCUS with positive high risk HPV cervical 2013   normal colposcopy  . Asthma   . GERD (gastroesophageal reflux disease)   . Herpes simplex   . IBS (irritable bowel syndrome)     Past Surgical History:  Procedure Laterality Date  . APPENDECTOMY  1993  . eyellid  2010  . THERAPEUTIC ABORTION     X 2  . tubes in ears     as a child    Review of systems negative except as noted in HPI / PMHx or noted below:  Review of Systems  Constitutional: Negative.   HENT: Negative.   Eyes: Negative.   Respiratory: Negative.   Cardiovascular: Negative.   Gastrointestinal: Negative.     Genitourinary: Negative.   Musculoskeletal: Negative.   Skin: Negative.   Neurological: Negative.   Endo/Heme/Allergies: Negative.   Psychiatric/Behavioral: Negative.      Objective:   Vitals:   09/05/19 1604  BP: 116/72  Pulse: 74  Resp: 18  SpO2: 97%   Height: 5\' 6"  (167.6 cm)      Physical Exam Constitutional:      Appearance: She is not diaphoretic.  HENT:     Head: Normocephalic.     Right Ear: Tympanic membrane, ear canal and external ear normal.     Left Ear: Tympanic membrane, ear canal and external ear normal.     Nose: Nose normal. No mucosal edema or rhinorrhea.     Mouth/Throat:     Pharynx: Uvula midline. No oropharyngeal exudate.  Eyes:     Conjunctiva/sclera: Conjunctivae normal.  Neck:     Thyroid: No thyromegaly.     Trachea: Trachea normal. No tracheal tenderness or tracheal deviation.  Cardiovascular:     Rate and Rhythm: Normal rate and regular rhythm.     Heart sounds: Normal heart sounds, S1 normal and S2 normal. No murmur heard.   Pulmonary:     Effort: No respiratory distress.     Breath sounds: Normal breath sounds. No stridor. No wheezing or rales.  Lymphadenopathy:     Head:     Right side of head: No tonsillar adenopathy.     Left side of head: No tonsillar adenopathy.     Cervical: No cervical adenopathy.  Skin:    Findings: No erythema or rash.     Nails: There is no clubbing.  Neurological:     Mental Status: She is alert.     Diagnostics:    Spirometry was performed and demonstrated an FEV1 of 2.59 at 87 % of predicted.  Results of a rhinoscopy performed by Dr. Wilburn Cornelia, ENT, on 28 August 2019, identified the following:  Flexible laryngoscopy shows patent anterior nasal cavity with minimal crusting, no discharge or infection.  Normal base of tongue and supraglottis Normal vocal cord mobility without vocal cord nodule, mass, polyp or tumor. Hypopharynx normal without mass, pooling of secretions or aspiration. Moderate post  glottic erythema consistent with gastroesophageal reflux.   Assessment and Plan:   1. Asthma, moderate persistent, well-controlled   2. Perennial allergic rhinitis   3. LPRD (laryngopharyngeal reflux disease)     1.  Treat and prevent reflux / LPR:   A.  Pantoprazole 40 mg -1 tablet twice a day  B.  Famotidine 40 mg -1 tablet in evening  2.  Treat and prevent inflammation:   A. Alvesco 80 - 1 inhalations 2 times per day with spacer (Symbicort)  B. Nasonex - 1 spray each nostril 1-2 times per day  4. If needed:   A. Albuterol HFA - 2 inhalations evry 4-6 hours  B. OTC antihistamine  5. Return to clinic in 8 weeks or earlier if problem  6.  Further evaluation for inability to smell?  Elaysha appears to have a very good response to medical therapy directed against respiratory tract inflammation and reflux induced respiratory disease and she will continue to treat this issue.  However, she has developed anosmia and decreased ability to taste and I think we need to have her remove Symbicort given the fact that she may have developed this problem in the past when using Symbicort.  If she still remains with anosmia after a few weeks of eliminating Symbicort then I think we need to image her upper airways with a CT scan.  I would like for her to remain on therapy for a full 12 weeks.  It has been 4 weeks already and thus I will see her back in his clinic in 8 weeks or earlier if there is a problem and I there will probably be an opportunity to consolidate her treatment at that point.  Allena Katz, MD Allergy / Immunology [place]

## 2019-09-05 NOTE — Patient Instructions (Addendum)
  1.  Treat and prevent reflux / LPR:   A.  Pantoprazole 40 mg -1 tablet twice a day  B.  Famotidine 40 mg -1 tablet in evening  2.  Treat and prevent inflammation:   A. Alvesco 80 - 1 inhalations 2 times per day with spacer (Symbicort)  B. Nasonex - 1 spray each nostril 1-2 times per day  4. If needed:   A. Albuterol HFA - 2 inhalations evry 4-6 hours  B. OTC antihistamine  5. Return to clinic in 8 weeks or earlier if problem  6.  Further evaluation for inability to smell?

## 2019-09-06 ENCOUNTER — Encounter: Payer: Self-pay | Admitting: Allergy and Immunology

## 2019-09-07 ENCOUNTER — Other Ambulatory Visit: Payer: Self-pay | Admitting: *Deleted

## 2019-09-08 ENCOUNTER — Telehealth: Payer: Self-pay | Admitting: Allergy and Immunology

## 2019-09-08 ENCOUNTER — Other Ambulatory Visit: Payer: Self-pay | Admitting: Allergy and Immunology

## 2019-09-08 NOTE — Telephone Encounter (Signed)
Patient called and would like to know the status of Alvesco prescription. Patient states that she was told that it may need to be sent to a special pharmacy in [place] if her pharmacy did not fill it. Patient has not hear from her [facility] that it is ready. Patient is not in a huge rush as she was given a sample in the office.  Please advise.

## 2019-09-11 ENCOUNTER — Other Ambulatory Visit: Payer: Self-pay

## 2019-09-11 MED ORDER — ALVESCO 80 MCG/ACT IN AERS: INHALATION_SPRAY | RESPIRATORY_TRACT | 5 refills | Status: DC

## 2019-09-11 NOTE — Telephone Encounter (Signed)
Prescription sent to [facility]. Patient was notified.

## 2019-09-13 NOTE — Telephone Encounter (Signed)
Prior authorization for Alvesco has been submitted on covermyeds.

## 2019-09-15 NOTE — Telephone Encounter (Signed)
PA still pending.  

## 2019-09-15 NOTE — Telephone Encounter (Signed)
Received call from representative from [facility] regarding the prior authorization of Alvesco.  She said that the authorization was denied and that Catherine Rodriguez will need to try two alternatives before she can qualify for the Alvesco.  Alternative inhalers include QVAR, Flovent, and Asmanex.  Please advise.  ( [employer] also did notify the patient and let her know of the above information).

## 2019-09-18 NOTE — Telephone Encounter (Signed)
Talked with representative at [facility], [employer] and they will contact patient and let her know about co-pay and see if she wants to get it filled.  They had not filled the [place] yet. Should be $0 hopefully.

## 2019-09-18 NOTE — Telephone Encounter (Signed)
Please contact Pharma rep and ask him for help concerning this denial.

## 2019-09-19 ENCOUNTER — Telehealth: Payer: Self-pay

## 2019-09-19 NOTE — Telephone Encounter (Signed)
Talked with representative at [facility] in [place] and she said that since insurance denied Alvesco, it would cost patient regular copay price, which would be over $300.  I was a bit confused about this so I called Bill Riddick, our [employer] representative and he is going to contact the pharmacy.  Per [facility], patient should be able to get Alvesco for cash-pay option, which is up to two inhalers for $50.  He will keep me updated.  I called patient and updated her and we have placed another sample of Alvesco 80 up at front desk in [place] for her to pick up to tie her over until we get prescription straightened out.

## 2019-09-19 NOTE — Telephone Encounter (Signed)
Patient called stating she received a letter from her insurance company [employer] denying [place] being covered. It states the patient will need to try and fail two other inhalers. Patient states she has received samples of other inhalers before that did not work. Patient is wondering what is the next step.    [place]

## 2019-09-26 NOTE — Telephone Encounter (Signed)
Catherine Rodriguez Ship broker) called last week and let me know that he got things straightened out with [employer] . Called patient today and she did indeed receive that shipment of Alvesco from [employer] .

## 2019-10-27 ENCOUNTER — Encounter: Payer: Self-pay | Admitting: Obstetrics and Gynecology

## 2019-10-31 ENCOUNTER — Encounter: Payer: Self-pay | Admitting: Allergy and Immunology

## 2019-10-31 ENCOUNTER — Other Ambulatory Visit: Payer: Self-pay

## 2019-10-31 ENCOUNTER — Ambulatory Visit: Payer: BC Managed Care – PPO | Admitting: Allergy and Immunology

## 2019-10-31 VITALS — BP 118/80 | HR 95 | Resp 20 | Ht 64.0 in

## 2019-10-31 DIAGNOSIS — J3089 Other allergic rhinitis: Secondary | ICD-10-CM | POA: Diagnosis not present

## 2019-10-31 DIAGNOSIS — G43909 Migraine, unspecified, not intractable, without status migrainosus: Secondary | ICD-10-CM

## 2019-10-31 DIAGNOSIS — K219 Gastro-esophageal reflux disease without esophagitis: Secondary | ICD-10-CM

## 2019-10-31 DIAGNOSIS — J4540 Moderate persistent asthma, uncomplicated: Secondary | ICD-10-CM | POA: Diagnosis not present

## 2019-10-31 NOTE — Patient Instructions (Addendum)
  1.  Continue to treat and prevent reflux / LPR:   A.  Pantoprazole 40 mg -1 tablet twice a day  B.  Famotidine 40 mg -1 tablet in evening  2.  Continue to treat and prevent inflammation:   A.  Decrease Alvesco 80 - 1 inhalations 1 time per day  B.  Nasonex - 1 spray each nostril 1-2 times per day  3. If needed:   A. Albuterol HFA - 2 inhalations every 4-6 hours  B. OTC antihistamine  4. Return to clinic in 12 weeks or earlier if problem.  Taper medications?  5.  Obtain fall flu vaccine  6. Further evaluation for headache?

## 2019-10-31 NOTE — Progress Notes (Signed)
- [place] - [place]   Follow-up Note  Referring Provider: Susy Frizzle, MD Primary Provider: Susy Frizzle, MD Date of Office Visit: 10/31/2019  Subjective:   Catherine Rodriguez (DOB: [birth year]) is a 49 y.o. female who returns to the [facility] on 10/31/2019 in re-evaluation of the following:  HPI: Riann returns to this clinic in reevaluation of asthma and allergic rhinitis and LPR and possible adverse drug reaction.  Interestingly, after she discontinued her Symbicort her anosmia did resolve.  This is the second time in her life that this has occurred after utilizing Symbicort.  Her airway is doing quite well.  She does have a "attack" of asthma manifested as chest tightness and shortness of breath on occasion but this actually turned out to be a panic attack that is treated with Xanax.  She has no problems with her nose at this point in time.  Almost all of her cough has resolved and she has no throat issues and no reflux issues.  She has had minimal caffeine consumption.  3 days ago she developed a headache over her right eye that migrates to the back of her neck.  She did get nauseated at the beginning of this headache and she had some dizziness but now she just has a dull ache that is slowly improving especially today.  In questioning her about her headache pattern it appears as though she has a headache about every 1 to 3 weeks that is similar in intensity.  Allergies as of 10/31/2019      Reactions   Doxycycline Nausea And Vomiting   Macrobid [nitrofurantoin Macrocrystal] Nausea And Vomiting   Penicillins    Got real sick      Medication List    Adderall 30 MG tablet Generic drug: amphetamine-dextroamphetamine Take 30 mg by mouth 2 (two) times daily.   albuterol 108 (90 Base) MCG/ACT inhaler Commonly known as: VENTOLIN HFA Inhale 2 puffs into the lungs every 6 (six) hours as needed for wheezing or  shortness of breath.   ALPRAZolam 1 MG tablet Commonly known as: XANAX as directed.   Alvesco 80 MCG/ACT inhaler Generic drug: ciclesonide 1 inhalation two times daily with spacer   budesonide-formoterol 160-4.5 MCG/ACT inhaler Commonly known as: SYMBICORT Inhale 2 puffs into the lungs 2 (two) times daily.   chlorpheniramine-HYDROcodone 10-8 MG/5ML Suer Commonly known as: Tussionex Pennkinetic ER Take 5 mLs by mouth every 12 (twelve) hours as needed for cough.   D3-50 1.25 MG (50000 UT) capsule Generic drug: Cholecalciferol Take 1 tablet by mouth once a week.   eszopiclone 1 MG Tabs tablet Commonly known as: LUNESTA Take 1 mg by mouth at bedtime as needed for sleep. Take immediately before bedtime   Eszopiclone 3 MG Tabs Take 3 mg by mouth at bedtime.   famotidine 40 MG tablet Commonly known as: PEPCID Take 1 tablet in the evening   ibuprofen 800 MG tablet Commonly known as: ADVIL Take 1 tablet (800 mg total) by mouth every 8 (eight) hours as needed.   meloxicam 15 MG tablet Commonly known as: MOBIC Take 15 mg by mouth every other day.   mometasone 50 MCG/ACT nasal spray Commonly known as: Nasonex 1 spray each nostril 2 times per day   pantoprazole 40 MG tablet Commonly known as: PROTONIX Take 1 tablet (40 mg total) by mouth 2 (two) times daily.   promethazine 25 MG tablet Commonly known as: PHENERGAN Take by mouth.  traMADol 50 MG tablet Commonly known as: ULTRAM Take 50 mg by mouth 2 (two) times daily.   valACYclovir 500 MG tablet Commonly known as: VALTREX Take 1 tablet (500 mg total) by mouth 2 (two) times daily. At onset of outbreak for 5 days       Past Medical History:  Diagnosis Date  . ASCUS with positive high risk HPV cervical 2013   normal colposcopy  . Asthma   . GERD (gastroesophageal reflux disease)   . Herpes simplex   . IBS (irritable bowel syndrome)     Past Surgical History:  Procedure Laterality Date  . APPENDECTOMY  1993   . eyellid  2010  . THERAPEUTIC ABORTION     X 2  . tubes in ears     as a child    Review of systems negative except as noted in HPI / PMHx or noted below:  Review of Systems  Constitutional: Negative.   HENT: Negative.   Eyes: Negative.   Respiratory: Negative.   Cardiovascular: Negative.   Gastrointestinal: Negative.   Genitourinary: Negative.   Musculoskeletal: Negative.   Skin: Negative.   Neurological: Negative.   Endo/Heme/Allergies: Negative.   Psychiatric/Behavioral: Negative.      Objective:   Vitals:   10/31/19 1600  BP: 118/80  Pulse: 95  Resp: 20  SpO2: 95%   Height: 5\' 4"  (162.6 cm)      Physical Exam Constitutional:      Appearance: She is not diaphoretic.  HENT:     Head: Normocephalic.     Right Ear: Tympanic membrane, ear canal and external ear normal.     Left Ear: Tympanic membrane, ear canal and external ear normal.     Nose: Nose normal. No mucosal edema or rhinorrhea.     Mouth/Throat:     Pharynx: Uvula midline. No oropharyngeal exudate.  Eyes:     Conjunctiva/sclera: Conjunctivae normal.  Neck:     Thyroid: No thyromegaly.     Trachea: Trachea normal. No tracheal tenderness or tracheal deviation.  Cardiovascular:     Rate and Rhythm: Normal rate and regular rhythm.     Heart sounds: Normal heart sounds, S1 normal and S2 normal. No murmur heard.   Pulmonary:     Effort: No respiratory distress.     Breath sounds: Normal breath sounds. No stridor. No wheezing or rales.  Lymphadenopathy:     Head:     Right side of head: No tonsillar adenopathy.     Left side of head: No tonsillar adenopathy.     Cervical: No cervical adenopathy.  Skin:    Findings: No erythema or rash.     Nails: There is no clubbing.  Neurological:     Mental Status: She is alert.     Diagnostics:    Spirometry was performed and demonstrated an FEV1 of 2.49 at 89 % of predicted.  Assessment and Plan:   1. Asthma, moderate persistent,  well-controlled   2. Perennial allergic rhinitis   3. LPRD (laryngopharyngeal reflux disease)   4. Migraine syndrome     1.  Continue to treat and prevent reflux / LPR:   A.  Pantoprazole 40 mg -1 tablet twice a day  B.  Famotidine 40 mg -1 tablet in evening  2.  Continue to treat and prevent inflammation:   A.  Decrease Alvesco 80 - 1 inhalations 1 time per day  B.  Nasonex - 1 spray each nostril 1-2 times per day  3.  If needed:   A. Albuterol HFA - 2 inhalations every 4-6 hours  B. OTC antihistamine  4. Return to clinic in 12 weeks or earlier if problem.  Taper medications?  5.  Obtain fall flu vaccine  6. Further evaluation for headache?  Harjot is doing better from an airway standpoint and we are going to decrease her inhaled steroid and I suspect that during her next visit in 12 weeks we may be able to completely taper off that medication and just focus on the treatment of reflux induced respiratory disease.  She has been having headaches on and off now for a while and I have informed her that should the frequency of these headaches continue then she needs further evaluation with her primary care doctor or neurologist about this issue.  Allena Katz, MD Allergy / Immunology [place]

## 2019-11-01 ENCOUNTER — Encounter: Payer: Self-pay | Admitting: Allergy and Immunology

## 2019-11-05 ENCOUNTER — Other Ambulatory Visit: Payer: Self-pay | Admitting: Family Medicine

## 2019-11-08 ENCOUNTER — Other Ambulatory Visit: Payer: Self-pay

## 2019-11-08 MED ORDER — PROMETHAZINE HCL 25 MG PO TABS: ORAL_TABLET | ORAL | 0 refills | Status: DC

## 2019-11-21 DIAGNOSIS — H903 Sensorineural hearing loss, bilateral: Secondary | ICD-10-CM | POA: Insufficient documentation

## 2020-01-16 ENCOUNTER — Other Ambulatory Visit: Payer: Self-pay | Admitting: Allergy and Immunology

## 2020-01-23 ENCOUNTER — Ambulatory Visit: Payer: BC Managed Care – PPO | Admitting: Allergy and Immunology

## 2020-01-24 ENCOUNTER — Ambulatory Visit: Payer: BC Managed Care – PPO | Admitting: Nurse Practitioner

## 2020-01-24 ENCOUNTER — Encounter: Payer: Self-pay | Admitting: Nurse Practitioner

## 2020-01-24 ENCOUNTER — Other Ambulatory Visit: Payer: Self-pay

## 2020-01-24 VITALS — BP 122/78 | Ht 64.0 in | Wt 252.0 lb

## 2020-01-24 DIAGNOSIS — Z23 Encounter for immunization: Secondary | ICD-10-CM

## 2020-01-24 DIAGNOSIS — R350 Frequency of micturition: Secondary | ICD-10-CM

## 2020-01-24 DIAGNOSIS — Z01419 Encounter for gynecological examination (general) (routine) without abnormal findings: Secondary | ICD-10-CM | POA: Diagnosis not present

## 2020-01-24 LAB — URINALYSIS W MICROSCOPIC + REFLEX CULTURE
Bacteria, UA: NONE SEEN /HPF
Bilirubin Urine: NEGATIVE
Glucose, UA: NEGATIVE
Hgb urine dipstick: NEGATIVE
Hyaline Cast: NONE SEEN /LPF
Ketones, ur: NEGATIVE
Leukocyte Esterase: NEGATIVE
Nitrites, Initial: NEGATIVE
Protein, ur: NEGATIVE
RBC / HPF: NONE SEEN /HPF (ref 0–2)
Specific Gravity, Urine: 1.01 (ref 1.001–1.03)
WBC, UA: NONE SEEN /HPF (ref 0–5)
pH: 6.5 (ref 5.0–8.0)

## 2020-01-24 LAB — NO CULTURE INDICATED

## 2020-01-24 NOTE — Progress Notes (Signed)
   Catherine Rodriguez 05-Feb-1971 [id number]   History:  49 y.o. G2P0020 presents for annual exam with complaints of urinary frequency that is not new for her. Denies dysuria, urgency, and hematuria. Last cycle February 2021 and was having more frequent hot flashes but these have improved. 2013 ASCUS positive HPV, subsequent paps normal. HSV, rare outbreaks. History of RA and asthma. Sister diagnosed with breast cancer at age 70.  Gynecologic History LMP: 04/2019 Contraception: abstinence Last Pap: 01/19/2018. Results were: normal Last mammogram: 10/27/2019. Results were: normal Last colonoscopy: 2013. Results were: polyps, 5 year repeat recommended  Past medical history, past surgical history, family history and social history were all reviewed and documented in the EPIC chart.  ROS:  A ROS was performed and pertinent positives and negatives are included.  Exam:  Vitals:   01/24/20 0910  BP: 122/78  Weight: 252 lb (114.3 kg)  Height: 5\' 4"  (1.626 m)   Body mass index is 43.26 kg/m.  General appearance:  Normal Thyroid:  Symmetrical, normal in size, without palpable masses or nodularity. Respiratory  Auscultation:  Clear without wheezing or rhonchi Cardiovascular  Auscultation:  Regular rate, without rubs, murmurs or gallops  Edema/varicosities:  Not grossly evident Abdominal  Soft,nontender, without masses, guarding or rebound.  Liver/spleen:  No organomegaly noted  Hernia:  None appreciated  Skin  Inspection:  Grossly normal   Breasts: Examined lying and sitting.   Right: Without masses, retractions, discharge or axillary adenopathy.   Left: Without masses, retractions, discharge or axillary adenopathy. Gentitourinary   Inguinal/mons:  Normal without inguinal adenopathy  External genitalia:  Normal  BUS/Urethra/Skene's glands:  Normal  Vagina:  Normal  Cervix:  Normal  Uterus: Difficult to palpate due to body habitus but no gross masses or  tendernes  Adnexa/parametria:     Rt: Without masses or tenderness.   Lt: Without masses or tenderness.  Anus and perineum: Normal  Digital rectal exam: Normal sphincter tone without palpated masses or tenderness  Assessment/Plan:  49 y.o. G2P0020 for annual exam.   Well female exam with routine gynecological exam - Education provided on SBEs, importance of preventative screenings, current guidelines, high calcium diet, regular exercise, and multivitamin daily. Labs with PCP.  Urinary frequency - Plan: Urinalysis w microscopic + reflex culture.  This is not new for her and says it started about 3 to 4 years ago.  Denies any other urinary symptoms.  Recommend Kegel exercises and pelvic floor strengthening.  Avoid acidic beverages such as coffee, soda, and tea.  Make sure to fully empty bladder with each void.  Need for immunization against influenza - Plan: Flu Vaccine QUAD 36+ mos IM  Screening for cervical cancer -2013 ASCUS with positive HPV, prior and subsequent paps normal.  Will repeat at 3-year interval per guidelines.  Screening for breast cancer -normal mammogram history.  Sister diagnosed with breast cancer at age 29.  Continue annual screenings.  Normal breast exam today.  Screening for colon cancer -overdue for screening colonoscopy per GI recommendations.  Information provided on the bowel or GI and she will schedule this soon.  Follow-up in 1 year for annual.     Tamela Gammon [facility], 9:33 AM 01/24/2020

## 2020-01-24 NOTE — Patient Instructions (Addendum)
Schedule colonoscopy! [facility] [phone] [place], [place] [id number]   Health Maintenance, Female Adopting a healthy lifestyle and getting preventive care are important in promoting health and wellness. Ask your health care provider about:  The right schedule for you to have regular tests and exams.  Things you can do on your own to prevent diseases and keep yourself healthy. What should I know about diet, weight, and exercise? Eat a healthy diet   Eat a diet that includes plenty of vegetables, fruits, low-fat dairy products, and lean protein.  Do not eat a lot of foods that are high in solid fats, added sugars, or sodium. Maintain a healthy weight Body mass index (BMI) is used to identify weight problems. It estimates body fat based on height and weight. Your health care provider can help determine your BMI and help you achieve or maintain a healthy weight. Get regular exercise Get regular exercise. This is one of the most important things you can do for your health. Most adults should:  Exercise for at least 150 minutes each week. The exercise should increase your heart rate and make you sweat (moderate-intensity exercise).  Do strengthening exercises at least twice a week. This is in addition to the moderate-intensity exercise.  Spend less time sitting. Even light physical activity can be beneficial. Watch cholesterol and blood lipids Have your blood tested for lipids and cholesterol at 51 years of age, then have this test every 5 years. Have your cholesterol levels checked more often if:  Your lipid or cholesterol levels are high.  You are older than 51 years of age.  You are at high risk for heart disease. What should I know about cancer screening? Depending on your health history and family history, you may need to have cancer screening at various ages. This may include screening for:  Breast cancer.  Cervical cancer.  Colorectal cancer.  Skin  cancer.  Lung cancer. What should I know about heart disease, diabetes, and high blood pressure? Blood pressure and heart disease  High blood pressure causes heart disease and increases the risk of stroke. This is more likely to develop in people who have high blood pressure readings, are of African descent, or are overweight.  Have your blood pressure checked: ? Every 3-5 years if you are 26-62 years of age. ? Every year if you are 96 years old or older. Diabetes Have regular diabetes screenings. This checks your fasting blood sugar level. Have the screening done:  Once every three years after age 90 if you are at a normal weight and have a low risk for diabetes.  More often and at a younger age if you are overweight or have a high risk for diabetes. What should I know about preventing infection? Hepatitis B If you have a higher risk for hepatitis B, you should be screened for this virus. Talk with your health care provider to find out if you are at risk for hepatitis B infection. Hepatitis C Testing is recommended for:  Everyone born from [birth year] through 1967.  Anyone with known risk factors for hepatitis C. Sexually transmitted infections (STIs)  Get screened for STIs, including gonorrhea and chlamydia, if: ? You are sexually active and are younger than 51 years of age. ? You are older than 51 years of age and your health care provider tells you that you are at risk for this type of infection. ? Your sexual activity has changed since you were last screened, and  you are at increased risk for chlamydia or gonorrhea. Ask your health care provider if you are at risk.  Ask your health care provider about whether you are at high risk for HIV. Your health care provider may recommend a prescription medicine to help prevent HIV infection. If you choose to take medicine to prevent HIV, you should first get tested for HIV. You should then be tested every 3 months for as long as you are taking  the medicine. Pregnancy  If you are about to stop having your period (premenopausal) and you may become pregnant, seek counseling before you get pregnant.  Take 400 to 800 micrograms (mcg) of folic acid every day if you become pregnant.  Ask for birth control (contraception) if you want to prevent pregnancy. Osteoporosis and menopause Osteoporosis is a disease in which the bones lose minerals and strength with aging. This can result in bone fractures. If you are 41 years old or older, or if you are at risk for osteoporosis and fractures, ask your health care provider if you should:  Be screened for bone loss.  Take a calcium or vitamin D supplement to lower your risk of fractures.  Be given hormone replacement therapy (HRT) to treat symptoms of menopause. Follow these instructions at home: Lifestyle  Do not use any products that contain nicotine or tobacco, such as cigarettes, e-cigarettes, and chewing tobacco. If you need help quitting, ask your health care provider.  Do not use street drugs.  Do not share needles.  Ask your health care provider for help if you need support or information about quitting drugs. Alcohol use  Do not drink alcohol if: ? Your health care provider tells you not to drink. ? You are pregnant, may be pregnant, or are planning to become pregnant.  If you drink alcohol: ? Limit how much you use to 0-1 drink a day. ? Limit intake if you are breastfeeding.  Be aware of how much alcohol is in your drink. In the [place] one drink equals one 12 oz bottle of beer (355 mL), one 5 oz glass of wine (148 mL), or one 1 oz glass of hard liquor (44 mL). General instructions  Schedule regular health, dental, and eye exams.  Stay current with your vaccines.  Tell your health care provider if: ? You often feel depressed. ? You have ever been abused or do not feel safe at home. Summary  Adopting a healthy lifestyle and getting preventive care are important in  promoting health and wellness.  Follow your health care provider's instructions about healthy diet, exercising, and getting tested or screened for diseases.  Follow your health care provider's instructions on monitoring your cholesterol and blood pressure. This information is not intended to replace advice given to you by your health care provider. Make sure you discuss any questions you have with your health care provider. Document Revised: 03/02/2018 Document Reviewed: 03/02/2018 Elsevier Patient Education  2020 [employer].

## 2020-02-20 ENCOUNTER — Ambulatory Visit: Payer: BC Managed Care – PPO | Admitting: Allergy and Immunology

## 2020-02-20 ENCOUNTER — Other Ambulatory Visit: Payer: Self-pay

## 2020-02-20 ENCOUNTER — Encounter: Payer: Self-pay | Admitting: Allergy and Immunology

## 2020-02-20 VITALS — BP 128/86 | HR 94 | Temp 97.6°F | Resp 16

## 2020-02-20 DIAGNOSIS — J4540 Moderate persistent asthma, uncomplicated: Secondary | ICD-10-CM | POA: Diagnosis not present

## 2020-02-20 DIAGNOSIS — R0689 Other abnormalities of breathing: Secondary | ICD-10-CM | POA: Diagnosis not present

## 2020-02-20 DIAGNOSIS — J3089 Other allergic rhinitis: Secondary | ICD-10-CM

## 2020-02-20 DIAGNOSIS — K219 Gastro-esophageal reflux disease without esophagitis: Secondary | ICD-10-CM | POA: Diagnosis not present

## 2020-02-20 MED ORDER — PANTOPRAZOLE SODIUM 40 MG PO TBEC: 40.0000 mg | DELAYED_RELEASE_TABLET | Freq: Two times a day (BID) | ORAL | 3 refills | Status: DC

## 2020-02-20 MED ORDER — TRELEGY ELLIPTA 200-62.5-25 MCG/INH IN AEPB: 1.0000 | INHALATION_SPRAY | Freq: Every day | RESPIRATORY_TRACT | 5 refills | Status: DC

## 2020-02-20 MED ORDER — TRELEGY ELLIPTA 200-62.5-25 MCG/INH IN AEPB
1.0000 | INHALATION_SPRAY | Freq: Every day | RESPIRATORY_TRACT | 5 refills | Status: DC
Start: 2020-02-20 — End: 2020-05-14

## 2020-02-20 NOTE — Patient Instructions (Addendum)
  1.  Continue to treat and prevent reflux / LPR:   A.  Pantoprazole 40 mg -1 tablet twice a day  B.  Famotidine 40 mg -1 tablet in evening  2.  Continue to treat and prevent inflammation:   A.  Trelegy 200 - 1 inhalation 1 time per day  B.  Nasonex - 1 spray each nostril 1-2 times per day  3. If needed:   A. Albuterol HFA - 2 inhalations every 4-6 hours  B. OTC antihistamine  4.  Visit with pulmonology regarding exercise-induced oxygen issue  5.  Return to clinic in 12 weeks or earlier if problem

## 2020-02-20 NOTE — Telephone Encounter (Signed)
Patient said she no longer has periods.  She said she still cramps off and on all the time. I asked her how many days a month she needs to take it and she said"probably 2 weeks out of a month" and she takes q8hours. She said this is the only thing that has ever helped her.

## 2020-02-20 NOTE — Progress Notes (Signed)
[facility] [place] [facility] [place]   Follow-up Note  Referring Provider: Susy Frizzle, MD Primary Provider: Susy Frizzle, MD Date of Office Visit: 02/20/2020  Subjective:   Catherine Rodriguez (DOB: 08/28/1970) is a 49 y.o. female who returns to the [facility] on 02/20/2020 in re-evaluation of the following:  HPI: Danielle returns to this clinic in reevaluation of asthma and allergic rhinitis and LPR and migraine headache and a possible drug reaction directed against Symbicort.   She has been having some issues with feeling very short of breath when she exerts herself.  Her recovery time is about 5 minutes.  She does not have any associated chest pain or other respiratory tract symptoms.  She has had a little bit of slight cough intermittently over the course of the past several weeks.  Her nose has been doing very well.  Her reflux has been under very good control but if she misses one of her reflux medicine she develops classic reflux symptoms.  She had no throat issues.  Allergies as of 02/20/2020      Reactions   Doxycycline Nausea And Vomiting   Macrobid [nitrofurantoin Macrocrystal] Nausea And Vomiting   Penicillins    Got real sick      Medication List    Adderall 30 MG tablet Generic drug: amphetamine-dextroamphetamine Take 30 mg by mouth 2 (two) times daily.   albuterol 108 (90 Base) MCG/ACT inhaler Commonly known as: VENTOLIN HFA INHALE 2 PUFFS BY MOUTH EVERY 6 HOURS AS NEEDED FOR WHEEZING FOR SHORTNESS OF BREATH   ALPRAZolam 1 MG tablet Commonly known as: XANAX as directed.   Alvesco 80 MCG/ACT inhaler Generic drug: ciclesonide 1 inhalation two times daily with spacer   chlorpheniramine-HYDROcodone 10-8 MG/5ML Suer Commonly known as: Tussionex Pennkinetic ER Take 5 mLs by mouth every 12 (twelve) hours as needed for cough.   D3-50 1.25 MG (50000 UT) capsule Generic drug: Cholecalciferol Take 1 tablet by  mouth once a week.   eszopiclone 1 MG Tabs tablet Commonly known as: LUNESTA Take 1 mg by mouth at bedtime as needed for sleep. Take immediately before bedtime   Eszopiclone 3 MG Tabs Take 3 mg by mouth at bedtime.   famotidine 40 MG tablet Commonly known as: PEPCID Take 1 tablet in the evening   ibuprofen 800 MG tablet Commonly known as: ADVIL Take 1 tablet (800 mg total) by mouth every 8 (eight) hours as needed.   meloxicam 15 MG tablet Commonly known as: MOBIC Take 15 mg by mouth every other day.   mometasone 50 MCG/ACT nasal spray Commonly known as: Nasonex 1 spray each nostril 2 times per day   pantoprazole 40 MG tablet Commonly known as: PROTONIX Take 1 tablet (40 mg total) by mouth 2 (two) times daily.   promethazine 25 MG tablet Commonly known as: PHENERGAN TAKE 1 TABLET BY MOUTH EVERY 8 HOURS AS NEEDED FOR NAUSEA FOR VOMITING   traMADol 50 MG tablet Commonly known as: ULTRAM Take 50 mg by mouth 2 (two) times daily.   valACYclovir 500 MG tablet Commonly known as: VALTREX Take 1 tablet (500 mg total) by mouth 2 (two) times daily. At onset of outbreak for 5 days       Past Medical History:  Diagnosis Date  . ASCUS with positive high risk HPV cervical 2013   normal colposcopy  . Asthma   . GERD (gastroesophageal reflux disease)   . Herpes simplex   .  IBS (irritable bowel syndrome)     Past Surgical History:  Procedure Laterality Date  . APPENDECTOMY  1993  . eyellid  2010  . THERAPEUTIC ABORTION     X 2  . tubes in ears     as a child    Review of systems negative except as noted in HPI / PMHx or noted below:  Review of Systems  Constitutional: Negative.   HENT: Negative.   Eyes: Negative.   Respiratory: Negative.   Cardiovascular: Negative.   Gastrointestinal: Negative.   Genitourinary: Negative.   Musculoskeletal: Negative.   Skin: Negative.   Neurological: Negative.   Endo/Heme/Allergies: Negative.   Psychiatric/Behavioral:  Negative.      Objective:   Vitals:   02/20/20 1638  BP: 128/86  Pulse: 94  Resp: 16  Temp: 97.6 F (36.4 C)  SpO2: 98%          Physical Exam Constitutional:      Appearance: She is not diaphoretic.  HENT:     Head: Normocephalic.     Right Ear: Tympanic membrane, ear canal and external ear normal.     Left Ear: Tympanic membrane, ear canal and external ear normal.     Nose: Nose normal. No mucosal edema or rhinorrhea.     Mouth/Throat:     Pharynx: Uvula midline. No oropharyngeal exudate.  Eyes:     Conjunctiva/sclera: Conjunctivae normal.  Neck:     Thyroid: No thyromegaly.     Trachea: Trachea normal. No tracheal tenderness or tracheal deviation.  Cardiovascular:     Rate and Rhythm: Normal rate and regular rhythm.     Heart sounds: Normal heart sounds, S1 normal and S2 normal. No murmur heard.   Pulmonary:     Effort: No respiratory distress.     Breath sounds: Normal breath sounds. No stridor. No wheezing or rales.  Lymphadenopathy:     Head:     Right side of head: No tonsillar adenopathy.     Left side of head: No tonsillar adenopathy.     Cervical: No cervical adenopathy.  Skin:    Findings: No erythema or rash.     Nails: There is no clubbing.  Neurological:     Mental Status: She is alert.     Diagnostics:    Spirometry was performed and demonstrated an FEV1 of 2.47 at 88 % of predicted.  The patient had an Asthma Control Test with the following results: ACT Total Score: 22.    Oxygen saturation on room air at rest was 98%.  Oxygen saturation on room air while walking the hallway was 90%  Assessment and Plan:   1. Not well controlled moderate persistent asthma   2. Perennial allergic rhinitis   3. LPRD (laryngopharyngeal reflux disease)   4. Impaired oxygenation     1.  Continue to treat and prevent reflux / LPR:   A.  Pantoprazole 40 mg -1 tablet twice a day  B.  Famotidine 40 mg -1 tablet in evening  2.  Continue to treat and  prevent inflammation:   A.  Trelegy 200 - 1 inhalation 1 time per day  B.  Nasonex - 1 spray each nostril 1-2 times per day  3. If needed:   A. Albuterol HFA - 2 inhalations every 4-6 hours  B. OTC antihistamine  4.  Visit with pulmonology regarding exercise-induced oxygen issue  5.  Return to clinic in 12 weeks or earlier if problem  Shanaye has a pretty significant  drop in oxygenation with minimal exertion and I would like for her to be evaluated by pulmonology to see if we are missing something regarding this exercise-induced deoxygenation while she continues on therapy directed against inflammation of her airway and therapy against reflux. We will get that evaluation and arrange as soon as possible. I will see her back in this clinic in 12 weeks or earlier if there is a problem.  Allena Katz, MD [facility] [place]

## 2020-02-20 NOTE — Telephone Encounter (Addendum)
Patient called in voice mail requesting refill on Ibuprofen 800 mg. Stating she forgot to ask you for it when she was in.  At 2018 [facility] with Dr. Loetta Rough he wrote "Patient notes that her menses are heavy with fairly heavy cramping.  Has been using ibuprofen 800 mg but does not seem to be working as well as it has in the past."

## 2020-02-20 NOTE — Telephone Encounter (Signed)
Based on refill history she is taking more often than the recommended times (for cramping with menses). Can we clarify how often she is taking? It is not recommended to take daily due to risk of decreasing bone density and gastric ulcers.

## 2020-02-21 ENCOUNTER — Encounter: Payer: Self-pay | Admitting: Allergy and Immunology

## 2020-02-21 MED ORDER — IBUPROFEN 800 MG PO TABS: 800.0000 mg | ORAL_TABLET | Freq: Three times a day (TID) | ORAL | 3 refills | Status: DC | PRN

## 2020-02-22 ENCOUNTER — Telehealth: Payer: Self-pay

## 2020-02-22 ENCOUNTER — Telehealth: Payer: Self-pay | Admitting: *Deleted

## 2020-02-22 NOTE — Telephone Encounter (Signed)
Patients referral has been placed to [place] at [facility]. I called and informed the patient of their information. I informed her to give them a call to schedule for a day that will work best for her.   Thanks   [place],  [place]  [address] [address]: [phone]

## 2020-02-22 NOTE — Telephone Encounter (Signed)
-----   Message from Derl Barrow, [place] sent at 02/20/2020  5:24 PM EST ----- Regarding: Pulmononary Referral Order has been placed for ambulatory referral [facility] for exercise induced oxygen issue.

## 2020-02-22 NOTE — Telephone Encounter (Signed)
PA has been submitted through CoverMyMeds for Trelegy and is currently pending approval or denial.

## 2020-02-26 ENCOUNTER — Telehealth: Payer: Self-pay

## 2020-02-26 IMAGING — US US ABDOMEN LIMITED
1 series · 14 of 25 positions shown · non-contrast
Comparison: CT 07/15/2010.

CLINICAL DATA: Right upper quadrant pain.

EXAM:
ULTRASOUND ABDOMEN LIMITED RIGHT UPPER QUADRANT

[Series 1: us abdomen limited · 0.23mm/px · 14 of 39 slices shown]
[im 1/39]
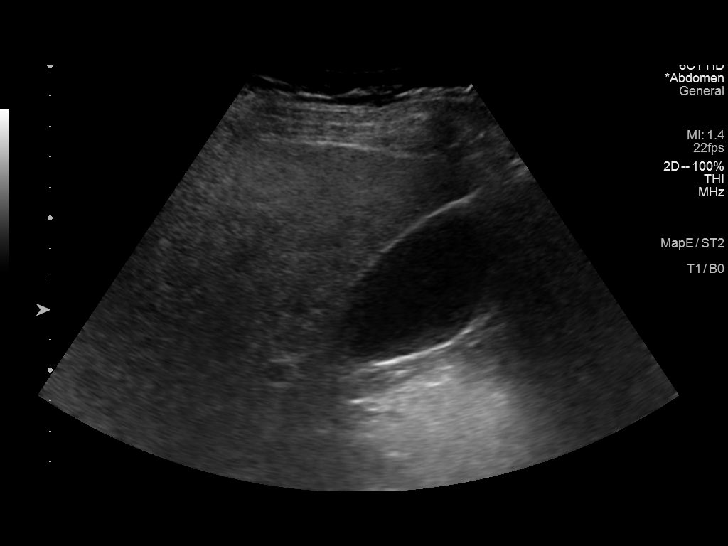
[im 4/39]
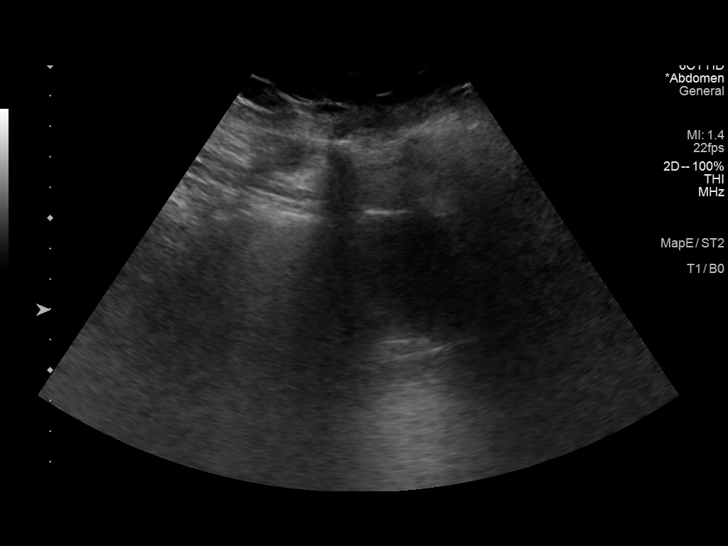
[im 7/39]
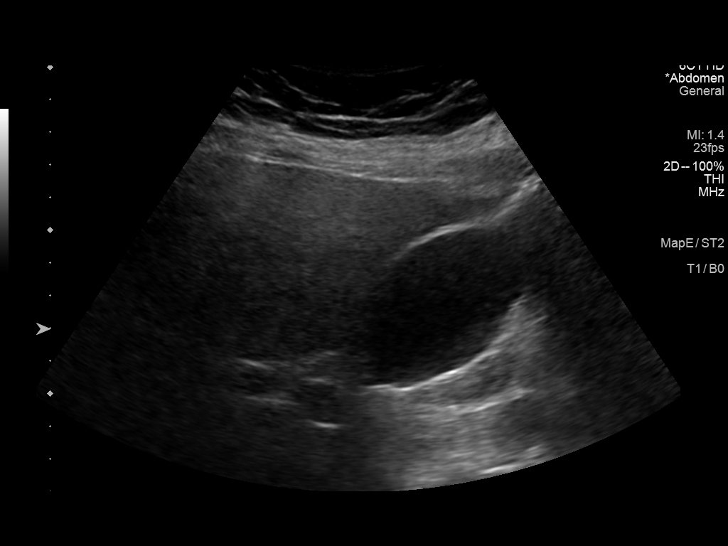
[im 10/39]
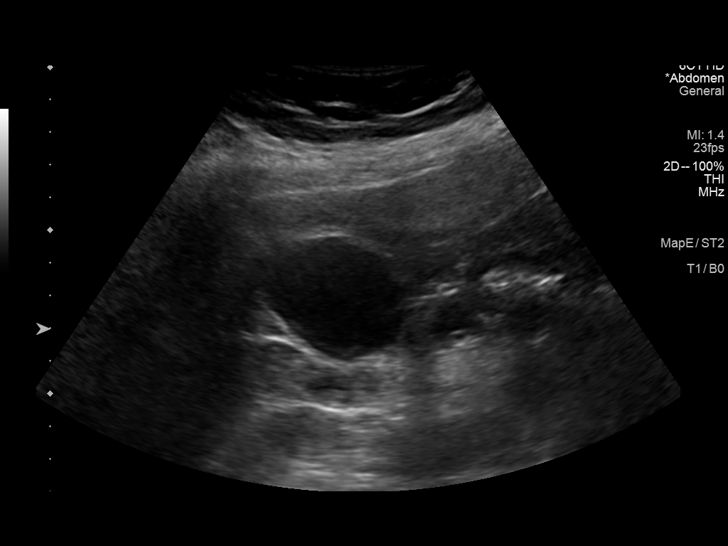
[im 13/39]
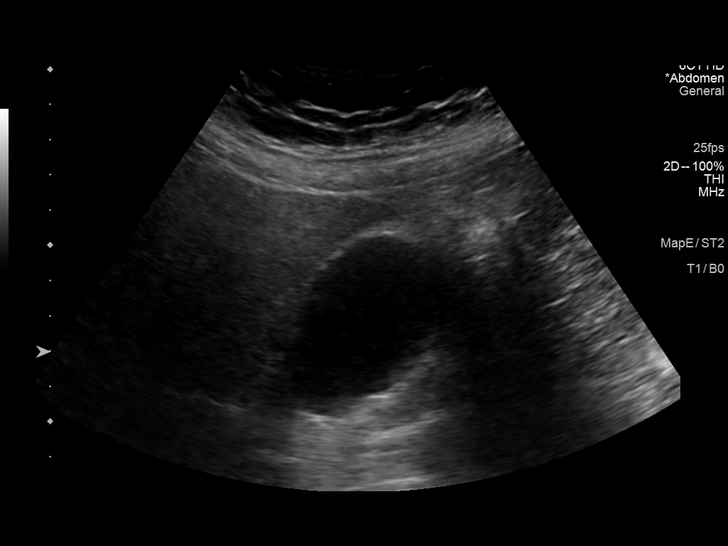
[im 15/39]
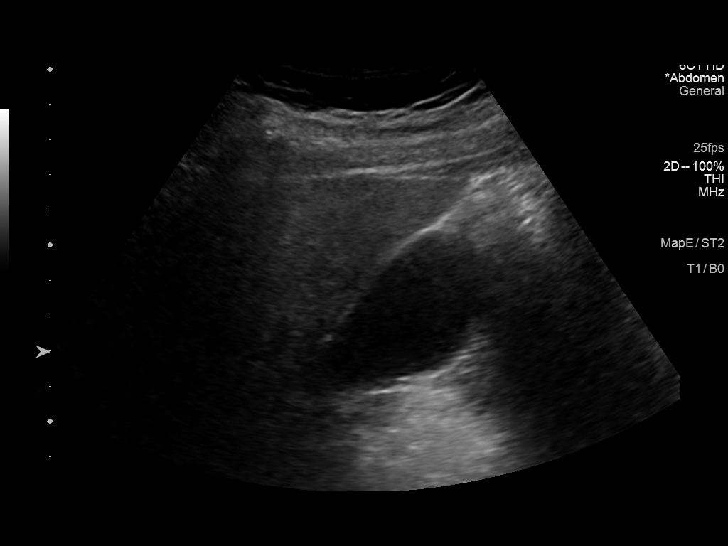
[im 18/39]
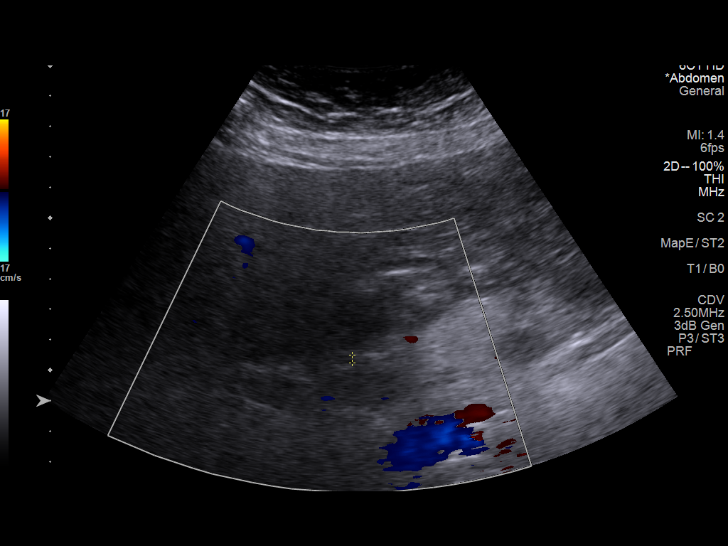
[im 21/39]
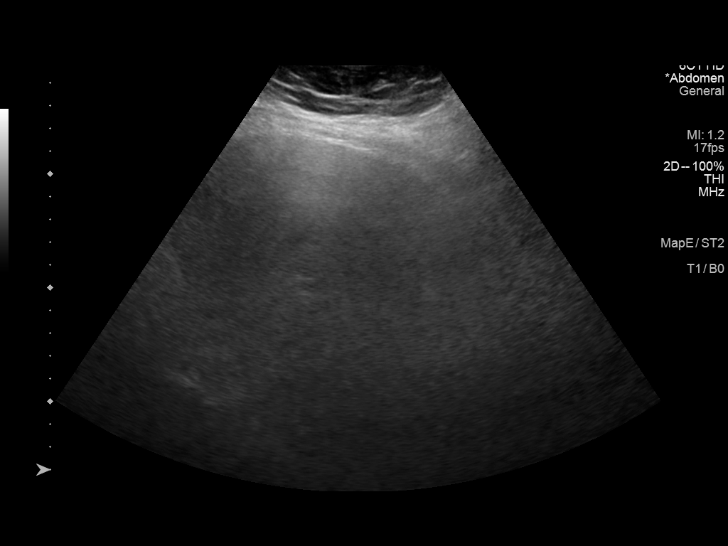
[im 24/39]
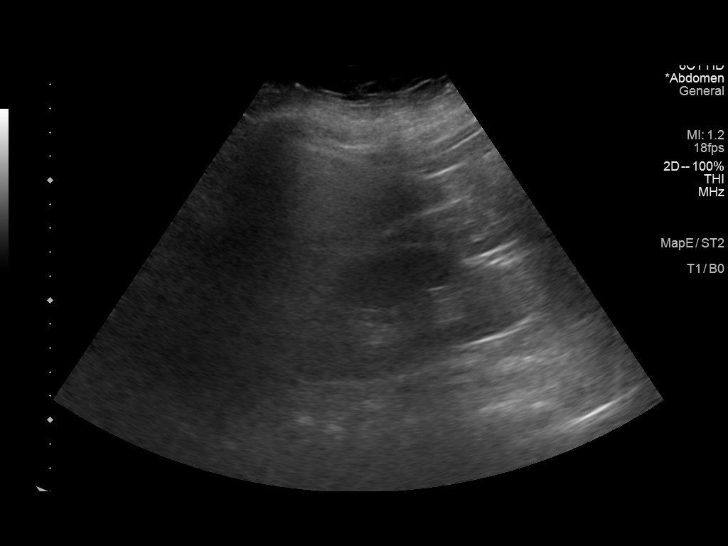
[im 26/39]
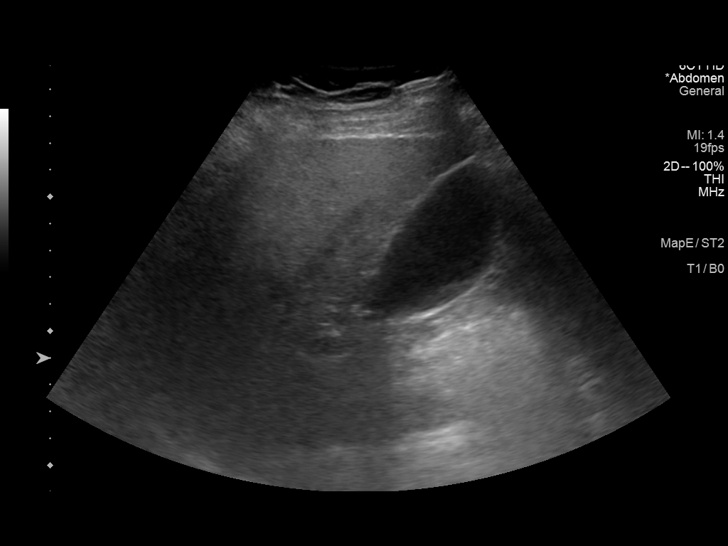
[im 29/39]
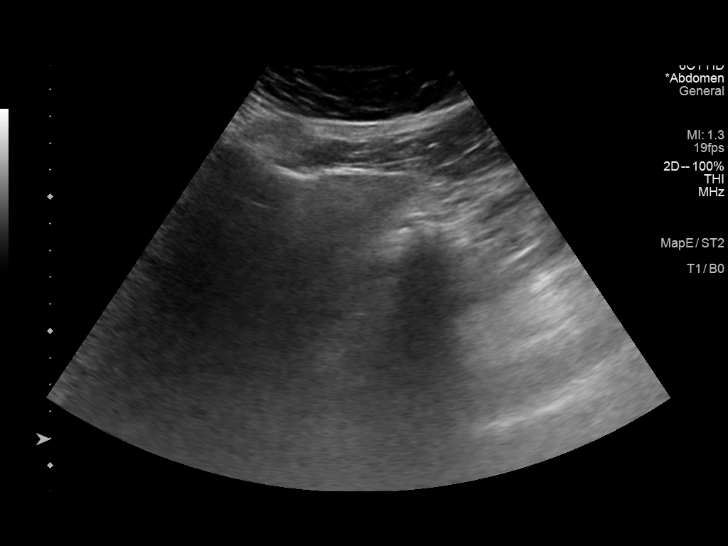
[im 32/39]
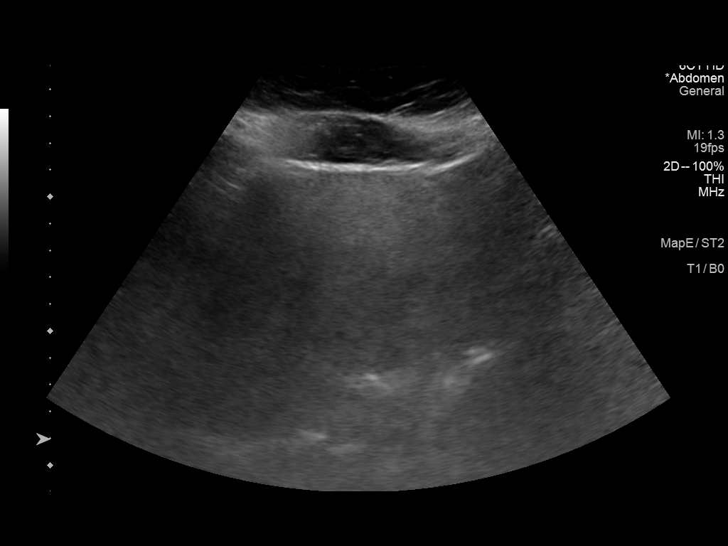
[im 35/39]
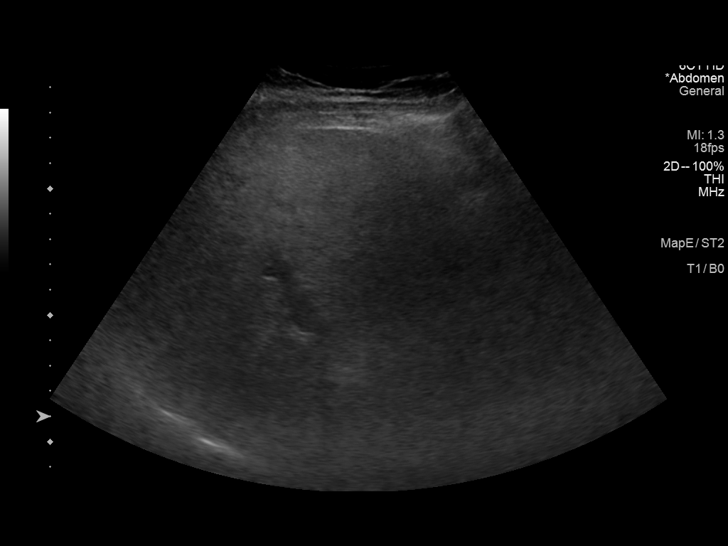
[im 39/39]
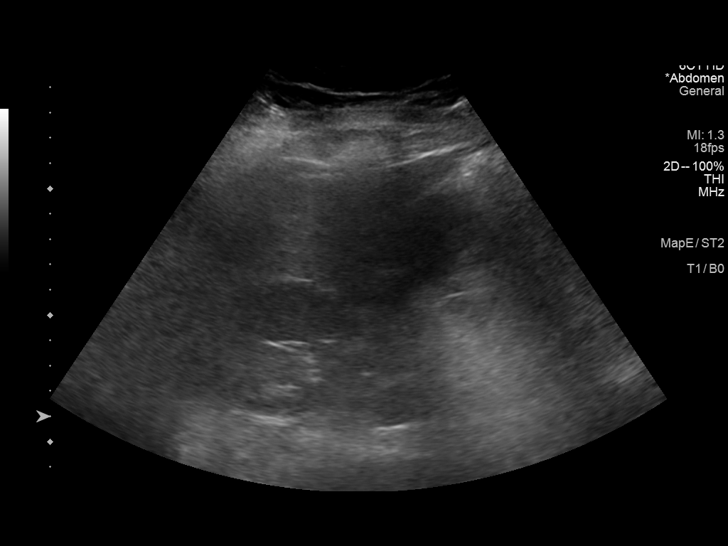

[14 of 25 positions shown; findings below may reference images not displayed]

FINDINGS: Gallbladder:

Minimal amount of sludge can not be excluded. No gallstones or wall
thickening visualized. No sonographic Murphy sign noted by
sonographer.

Common bile duct:

Diameter: 2.6 mm

Liver:

Increased echogenicity consistent with fatty infiltration or
hepatocellular disease. No focal hepatic abnormality identified.
Portal vein is patent on color Doppler imaging with normal direction
of blood flow towards the liver.

Other: None.
IMPRESSION: 1. Minimal amount of sludge can not be excluded. No gallstones or
biliary distention.

2. Increased hepatic echogenicity consistent fatty infiltration or
hepatocellular disease.

## 2020-02-26 MED ORDER — BREO ELLIPTA 200-25 MCG/INH IN AEPB: 1.0000 | INHALATION_SPRAY | Freq: Every day | RESPIRATORY_TRACT | 5 refills | Status: DC

## 2020-02-26 MED ORDER — INCRUSE ELLIPTA 62.5 MCG/INH IN AEPB: 1.0000 | INHALATION_SPRAY | Freq: Every day | RESPIRATORY_TRACT | 5 refills | Status: DC

## 2020-02-26 NOTE — Telephone Encounter (Signed)
Do you want to put patient back on Symbicort and add Spiriva to current regimen to get approved from insurance or do you want to try [facility]. Please advise?

## 2020-02-26 NOTE — Addendum Note (Signed)
Addended by: Valere Dross on: 02/26/2020 01:29 PM   Modules accepted: Orders

## 2020-02-26 NOTE — Telephone Encounter (Signed)
Patient is going to stay with Trelegy through [facility] in [place] to get it cheaper there

## 2020-02-26 NOTE — Telephone Encounter (Signed)
Trelegy was denied and need to do alternative.

## 2020-02-26 NOTE — Telephone Encounter (Signed)
She will need to use a combination of Breo 200-1 inhalation once a day and Incruse1 inhalation 1 time per day which is basically Trelegy

## 2020-02-26 NOTE — Telephone Encounter (Signed)
Pharmacy called to discuss patients Trelegy and Alvesco. I informed pharmacy that per the last note from Dr. Neldon Mc patient is using Trelegy 200 1 inhalation daily.

## 2020-02-26 NOTE — Telephone Encounter (Signed)
Breo and Incruse medication sent to [facility] listed in patient's chart.

## 2020-02-26 NOTE — Addendum Note (Signed)
Addended by: Valere Dross on: 02/26/2020 01:22 PM   Modules accepted: Orders

## 2020-03-25 ENCOUNTER — Other Ambulatory Visit: Payer: Self-pay

## 2020-03-25 ENCOUNTER — Encounter: Payer: Self-pay | Admitting: Pulmonary Disease

## 2020-03-25 ENCOUNTER — Ambulatory Visit (INDEPENDENT_AMBULATORY_CARE_PROVIDER_SITE_OTHER): Payer: BC Managed Care – PPO | Admitting: Pulmonary Disease

## 2020-03-25 VITALS — BP 120/78 | HR 95 | Temp 98.0°F | Ht 65.0 in | Wt 248.4 lb

## 2020-03-25 DIAGNOSIS — J45909 Unspecified asthma, uncomplicated: Secondary | ICD-10-CM

## 2020-03-25 DIAGNOSIS — R0683 Snoring: Secondary | ICD-10-CM | POA: Diagnosis not present

## 2020-03-25 DIAGNOSIS — E66813 Obesity, class 3: Secondary | ICD-10-CM

## 2020-03-25 NOTE — Patient Instructions (Signed)
We will refer you to the [facility] clinic to work on your goal of weight loss.   It is ok to increase your physical activity from a pulmonary standpoint as your oxygen remains in safe levels.   We will order you a home sleep study.  Continue on your inhalers per the asthma clinic for now.

## 2020-03-25 NOTE — Progress Notes (Signed)
Synopsis: Referred in 03/2020 for asthma  Subjective:   PATIENT ID: Catherine Rodriguez, Catherine Rodriguez   HPI  Chief Complaint  Patient presents with  . Consult    Dx with Asthma last year.  Did a walk test and told her that she was not getting enough oxygen.  SHOB with exertion   Catherine Rodriguez is a 50 year old woman, never smoker who is referred to pulmonary clinic for evaluation of decrease in her oxygen saturation with ambulation.   She is being followed at the [facility] for diagnosis of asthma which was diagnosed after an upper respiratory infection early 2021. She was having trouble with cough that was persistent and causing interruptions to her sleep at night. She also had spirometry done 08/08/19 that showed concern for possible obstruction. She was given the diagnosis of asthma at this time with symbicort 160-4.61mcg 2 puffs twice daily. She was also treated for reflux disease with PPI and H2 blocker therapy along with nasonex nasal spray to treat allergic rhinits. She has since been switched to trelegy 200 as she developed anosmia from the symbicort. She currently denies issues with cough, shortness of breath, wheezing or chest tightness. She does experience exertional shortness of breath.   While in the asthma and allergy clinic on 02/20/20, she was noted to have an oxygen saturation of 98% at rest and 90% while ambulating thus prompting the referral to pulmonary clinic. In clinic today she maintained an O2 saturation of 95% while ambulating.   She reports significant weight gain since the pandemic has started. She previously weighed 170lbs and felt great. She has knee pain which makes it difficult for her to be active. She reports sleeping well at night and does report history of snoring. She does feel rested after sleep and denies afternoon dips of sleepiness of fatigue.      She is a never smoker. Denies vaping or e-cigarette use.    Past Medical History:  Diagnosis Date  . ASCUS with positive high risk HPV cervical 2013   normal colposcopy  . Asthma   . GERD (gastroesophageal reflux disease)   . Herpes simplex   . IBS (irritable bowel syndrome)      Family History  Problem Relation Age of Onset  . Breast cancer Sister 73  . Hypertension Father   . Diabetes Paternal Grandmother   . Asthma Mother      Social History   Socioeconomic History  . Marital status: Single    Spouse name: Not on file  . Number of children: Not on file  . Years of education: Not on file  . Highest education level: Not on file  Occupational History  . Not on file  Tobacco Use  . Smoking status: Never Smoker  . Smokeless tobacco: Never Used  Vaping Use  . Vaping Use: Never used  Substance and Sexual Activity  . Alcohol use: Yes    Alcohol/week: 0.0 standard drinks    Comment: Occas wine  . Drug use: No  . Sexual activity: Not Currently    Comment: 1st intercourse 50 yo-Fewer than 5 partners  Other Topics Concern  . Not on file  Social History Narrative  . Not on file   Social Determinants of Health   Financial Resource Strain: Not on file  Food Insecurity: Not on file  Transportation Needs: Not on file  Physical Activity: Not on file  Stress: Not on file  Social Connections:  Not on file  Intimate Partner Violence: Not on file     Allergies  Allergen Reactions  . Doxycycline Nausea And Vomiting  . Macrobid [Nitrofurantoin Macrocrystal] Nausea And Vomiting  . Penicillins     Got real sick     Outpatient Medications Prior to Visit  Medication Sig Dispense Refill  . albuterol (VENTOLIN HFA) 108 (90 Base) MCG/ACT inhaler INHALE 2 PUFFS BY MOUTH EVERY 6 HOURS AS NEEDED FOR WHEEZING FOR SHORTNESS OF BREATH 18 g 0  . ALPRAZolam (XANAX) 1 MG tablet as directed.  0  . amphetamine-dextroamphetamine (ADDERALL) 30 MG tablet Take 30 mg by mouth 2 (two) times daily.    . chlorpheniramine-HYDROcodone (TUSSIONEX  PENNKINETIC ER) 10-8 MG/5ML SUER Take 5 mLs by mouth every 12 (twelve) hours as needed for cough. 140 mL 0  . D3-50 50000 units capsule Take 1 tablet by mouth once a week.  5  . eszopiclone (LUNESTA) 1 MG TABS tablet Take 1 mg by mouth at bedtime as needed for sleep. Take immediately before bedtime    . Eszopiclone 3 MG TABS Take 3 mg by mouth at bedtime.    . famotidine (PEPCID) 40 MG tablet Take 1 tablet in the evening 30 tablet 5  . Fluticasone-Umeclidin-Vilant (TRELEGY ELLIPTA) 200-62.5-25 MCG/INH AEPB Inhale 1 puff into the lungs daily. 28 each 5  . ibuprofen (ADVIL) 800 MG tablet Take 1 tablet (800 mg total) by mouth every 8 (eight) hours as needed. 90 tablet 3  . meloxicam (MOBIC) 15 MG tablet Take 15 mg by mouth every other day.    . mometasone (NASONEX) 50 MCG/ACT nasal spray 1 spray each nostril 2 times per day 17 g 5  . pantoprazole (PROTONIX) 40 MG tablet Take 1 tablet (40 mg total) by mouth 2 (two) times daily. 180 tablet 3  . promethazine (PHENERGAN) 25 MG tablet TAKE 1 TABLET BY MOUTH EVERY 8 HOURS AS NEEDED FOR NAUSEA FOR VOMITING 20 tablet 0  . traMADol (ULTRAM) 50 MG tablet Take 50 mg by mouth 2 (two) times daily.    . valACYclovir (VALTREX) 500 MG tablet Take 1 tablet (500 mg total) by mouth 2 (two) times daily. At onset of outbreak for 5 days 30 tablet 6  . ciclesonide (ALVESCO) 80 MCG/ACT inhaler 1 inhalation two times daily with spacer (Patient not taking: Reported on 03/25/2020) 1 Inhaler 5  . pantoprazole (PROTONIX) 40 MG tablet Take 1 tablet (40 mg total) by mouth 2 (two) times daily. (Patient not taking: Reported on 03/25/2020) 180 tablet 3   No facility-administered medications prior to visit.    Review of Systems  Constitutional: Negative for chills, fever, malaise/fatigue and weight loss.  HENT: Negative for congestion, sinus pain and sore throat.   Respiratory: Negative for cough, hemoptysis, sputum production, shortness of breath and wheezing.   Cardiovascular:  Negative for chest pain, palpitations, orthopnea, claudication and leg swelling.  Gastrointestinal: Negative for abdominal pain, heartburn, nausea and vomiting.  Genitourinary: Negative.   Musculoskeletal: Negative.   Neurological: Negative for dizziness, weakness and headaches.  Psychiatric/Behavioral: Negative.     Objective:   Vitals:   03/25/20 1048  BP: 120/78  Pulse: 95  Temp: 98 F (36.7 C)  TempSrc: Tympanic  SpO2: 97%  Weight: 248 lb 6 oz (112.7 kg)  Height: 5\' 5"  (1.651 m)     Physical Exam Constitutional:      Appearance: Normal appearance. She is obese.  HENT:     Head: Normocephalic and atraumatic.  Eyes:  General: No scleral icterus.    Conjunctiva/sclera: Conjunctivae normal.     Pupils: Pupils are equal, round, and reactive to light.  Cardiovascular:     Rate and Rhythm: Normal rate and regular rhythm.     Pulses: Normal pulses.     Heart sounds: Normal heart sounds. No murmur heard.   Pulmonary:     Effort: Pulmonary effort is normal.     Breath sounds: Normal breath sounds. No wheezing, rhonchi or rales.  Abdominal:     General: Bowel sounds are normal.     Palpations: Abdomen is soft.  Musculoskeletal:     Right lower leg: No edema.     Left lower leg: No edema.  Skin:    General: Skin is warm and dry.     Capillary Refill: Capillary refill takes less than 2 seconds.  Neurological:     General: No focal deficit present.     Mental Status: She is alert.  Psychiatric:        Mood and Affect: Mood normal.        Behavior: Behavior normal.        Thought Content: Thought content normal.        Judgment: Judgment normal.    CBC    Component Value Date/Time   WBC 7.0 07/03/2019 0940   RBC 5.01 07/03/2019 0940   HGB 15.0 07/03/2019 0940   HCT 44.5 07/03/2019 0940   PLT 303 07/03/2019 0940   MCV 88.8 07/03/2019 0940   MCH 29.9 07/03/2019 0940   MCHC 33.7 07/03/2019 0940   RDW 13.2 07/03/2019 0940   LYMPHSABS 3,031 07/03/2019 0940    MONOABS 480 01/17/2016 1506   EOSABS 182 07/03/2019 0940   BASOSABS 70 07/03/2019 0940   BMP Latest Ref Rng & Units 07/03/2019 04/17/2019 01/23/2019  Glucose 65 - 99 mg/dL 79 79 77  BUN 7 - 25 mg/dL 16 12 15   Creatinine 0.50 - 1.05 mg/dL 0.86 0.93 0.93  BUN/Creat Ratio 6 - 22 (calc) NOT APPLICABLE NOT APPLICABLE NOT APPLICABLE  Sodium A999333 - 146 mmol/L 139 139 137  Potassium 3.5 - 5.3 mmol/L 4.3 4.6 4.3  Chloride 98 - 110 mmol/L 103 104 101  CO2 20 - 32 mmol/L 28 30 24   Calcium 8.6 - 10.4 mg/dL 9.6 9.5 10.0   Chest imaging: CXR 07/03/19 The cardiomediastinal silhouette is within normal limits. The lungs are well inflated and clear. There is no evidence of pleural effusion or pneumothorax. No acute osseous abnormality is Identified.  PFT: No flowsheet data found.  Assessment & Plan:   Asthma, unspecified asthma severity, unspecified whether complicated, unspecified whether persistent - Plan: Amb Ref to Medical Weight Management  Snoring - Plan: Home sleep test  Class 3 severe obesity due to excess calories without serious comorbidity in adult, unspecified BMI ([place]) - Plan: Amb Ref to Medical Weight Management  Discussion: Leiyana Levy is a 50 year old woman, never smoker who is referred to pulmonary clinic for evaluation of decrease in her oxygen saturation with ambulation.  She has no signs of significant oxygen desaturations on ambulatory oxygen monitoring today in clinic. She is safe to participate in physical activity from a respiratory standpoint and has been encouraged to increase her physical activity on a gradual basis. She does have a treadmill at home and we discussed starting out using it 10 minutes per day and slowly increasing her time. She may also consider a stationary bicycle due to her knee pain.   In  regards to her asthma diagnosis, this may be reactive airways disease secondary to a viral upper respiratory infection she suffered in early 2021. Prior to that  episode of illness, she denies any respiratory issues. A trial of weaning off maintenance inhaler therapy should be considered in the future.   We discussed how her weight can be contributing to her exertional shortness of breath. She is open to being referred to the Weight Management clinic. We also discussed how her weight can lead to worsening of her reflux and reactive airways symptoms. Given her weight and history of snoring, we will check a home sleep study to rule out obstructive sleep apnea.   She is to follow up in 3 months.  Catherine Comas, MD [facility] Office: [phone]   See Amion for Pager Details    Current Outpatient Medications:  .  albuterol (VENTOLIN HFA) 108 (90 Base) MCG/ACT inhaler, INHALE 2 PUFFS BY MOUTH EVERY 6 HOURS AS NEEDED FOR WHEEZING FOR SHORTNESS OF BREATH, Disp: 18 g, Rfl: 0 .  ALPRAZolam (XANAX) 1 MG tablet, as directed., Disp: , Rfl: 0 .  amphetamine-dextroamphetamine (ADDERALL) 30 MG tablet, Take 30 mg by mouth 2 (two) times daily., Disp: , Rfl:  .  chlorpheniramine-HYDROcodone (TUSSIONEX PENNKINETIC ER) 10-8 MG/5ML SUER, Take 5 mLs by mouth every 12 (twelve) hours as needed for cough., Disp: 140 mL, Rfl: 0 .  D3-50 50000 units capsule, Take 1 tablet by mouth once a week., Disp: , Rfl: 5 .  eszopiclone (LUNESTA) 1 MG TABS tablet, Take 1 mg by mouth at bedtime as needed for sleep. Take immediately before bedtime, Disp: , Rfl:  .  Eszopiclone 3 MG TABS, Take 3 mg by mouth at bedtime., Disp: , Rfl:  .  famotidine (PEPCID) 40 MG tablet, Take 1 tablet in the evening, Disp: 30 tablet, Rfl: 5 .  Fluticasone-Umeclidin-Vilant (TRELEGY ELLIPTA) 200-62.5-25 MCG/INH AEPB, Inhale 1 puff into the lungs daily., Disp: 28 each, Rfl: 5 .  ibuprofen (ADVIL) 800 MG tablet, Take 1 tablet (800 mg total) by mouth every 8 (eight) hours as needed., Disp: 90 tablet, Rfl: 3 .  meloxicam (MOBIC) 15 MG tablet, Take 15 mg by mouth every other day., Disp: ,  Rfl:  .  mometasone (NASONEX) 50 MCG/ACT nasal spray, 1 spray each nostril 2 times per day, Disp: 17 g, Rfl: 5 .  pantoprazole (PROTONIX) 40 MG tablet, Take 1 tablet (40 mg total) by mouth 2 (two) times daily., Disp: 180 tablet, Rfl: 3 .  promethazine (PHENERGAN) 25 MG tablet, TAKE 1 TABLET BY MOUTH EVERY 8 HOURS AS NEEDED FOR NAUSEA FOR VOMITING, Disp: 20 tablet, Rfl: 0 .  traMADol (ULTRAM) 50 MG tablet, Take 50 mg by mouth 2 (two) times daily., Disp: , Rfl:  .  valACYclovir (VALTREX) 500 MG tablet, Take 1 tablet (500 mg total) by mouth 2 (two) times daily. At onset of outbreak for 5 days, Disp: 30 tablet, Rfl: 6

## 2020-05-02 ENCOUNTER — Other Ambulatory Visit: Payer: Self-pay

## 2020-05-02 ENCOUNTER — Ambulatory Visit: Payer: BC Managed Care – PPO

## 2020-05-02 DIAGNOSIS — G4733 Obstructive sleep apnea (adult) (pediatric): Secondary | ICD-10-CM | POA: Diagnosis not present

## 2020-05-02 DIAGNOSIS — R0683 Snoring: Secondary | ICD-10-CM

## 2020-05-06 DIAGNOSIS — G4733 Obstructive sleep apnea (adult) (pediatric): Secondary | ICD-10-CM | POA: Diagnosis not present

## 2020-05-12 IMAGING — DX DG CHEST 2V
2 series · 2 of 2 positions shown · non-contrast
Comparison: None.

CLINICAL DATA: Cough.  Right basilar crackles.

EXAM:
CHEST - 2 VIEW

[dg chest 2 view (1 of 2)]
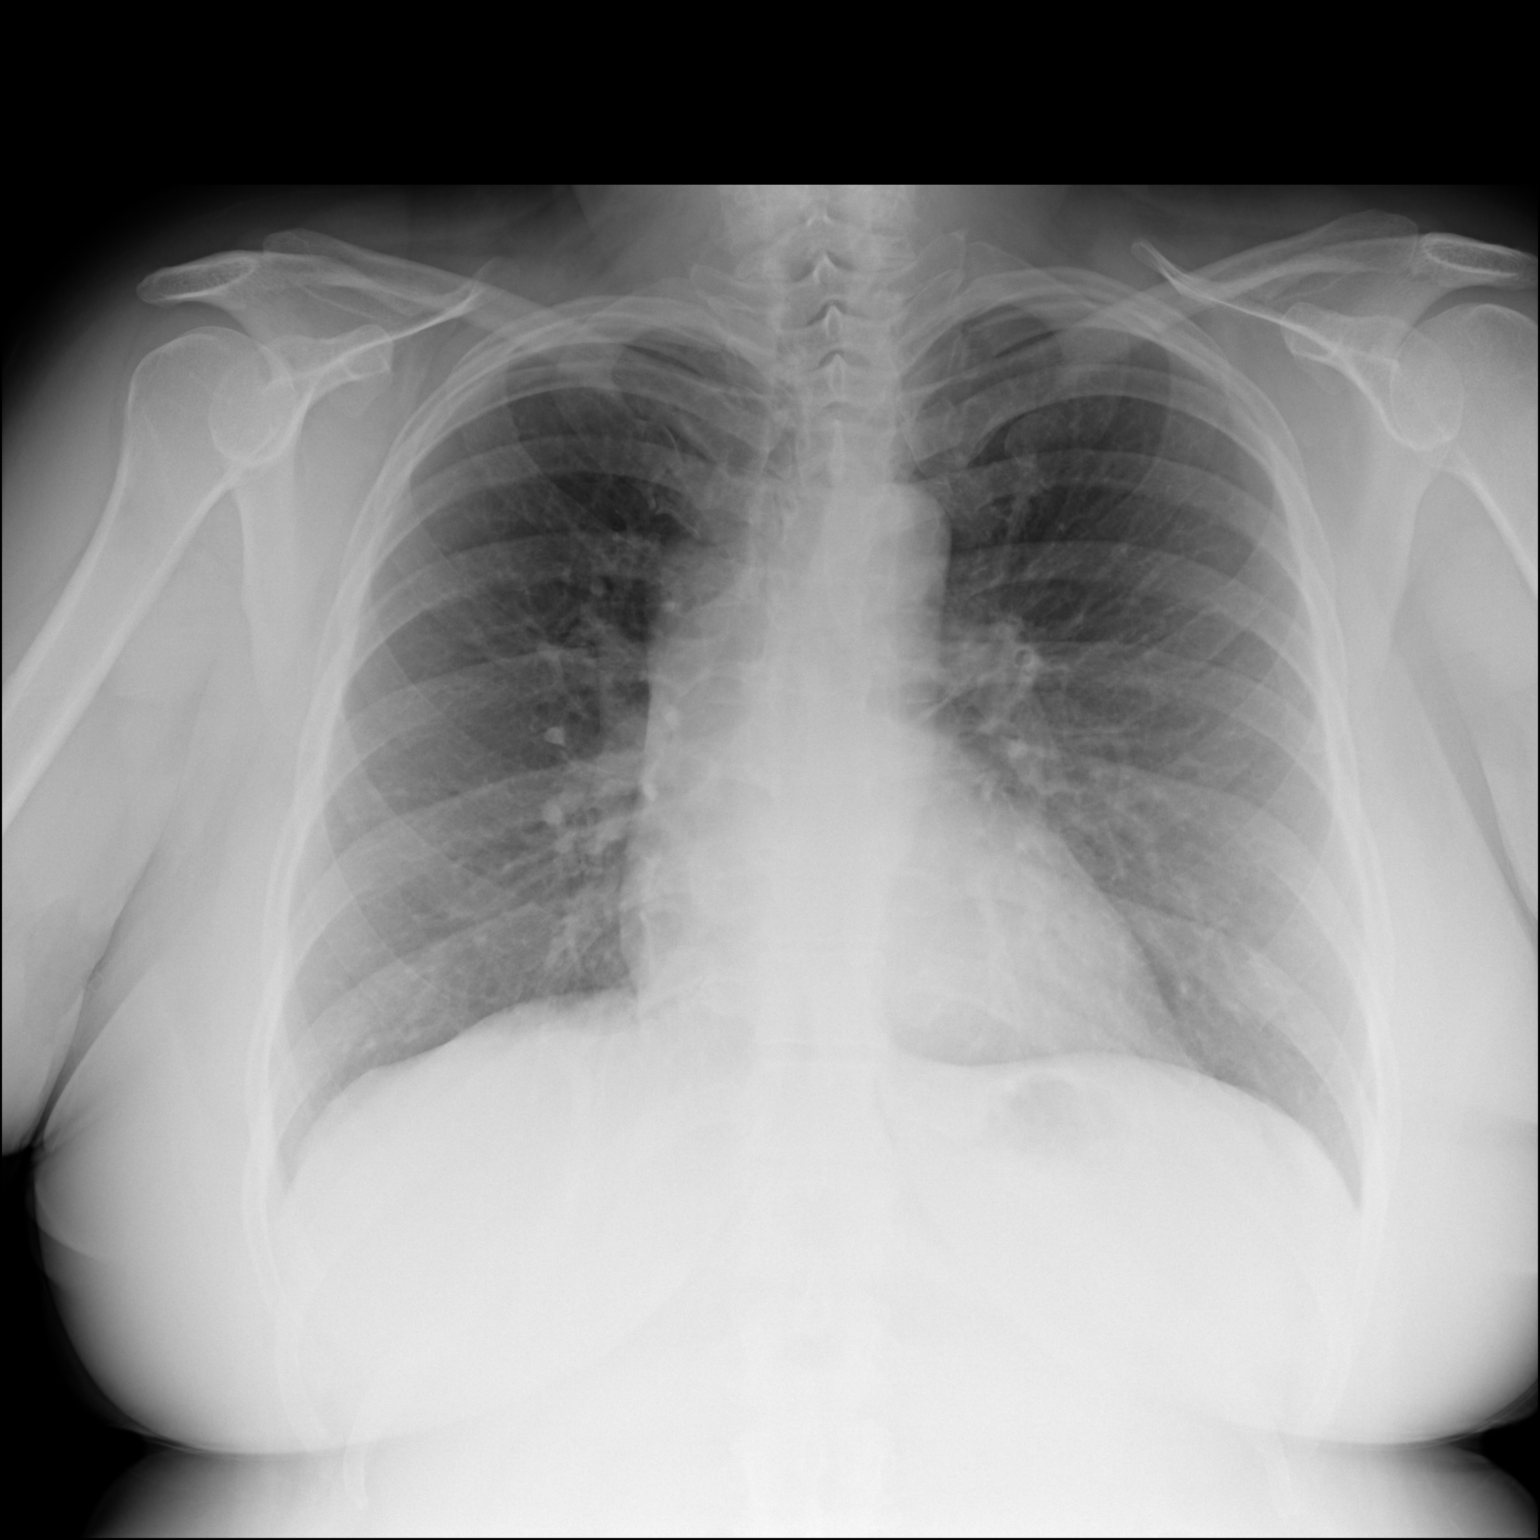

[dg chest 2 view (2 of 2)]
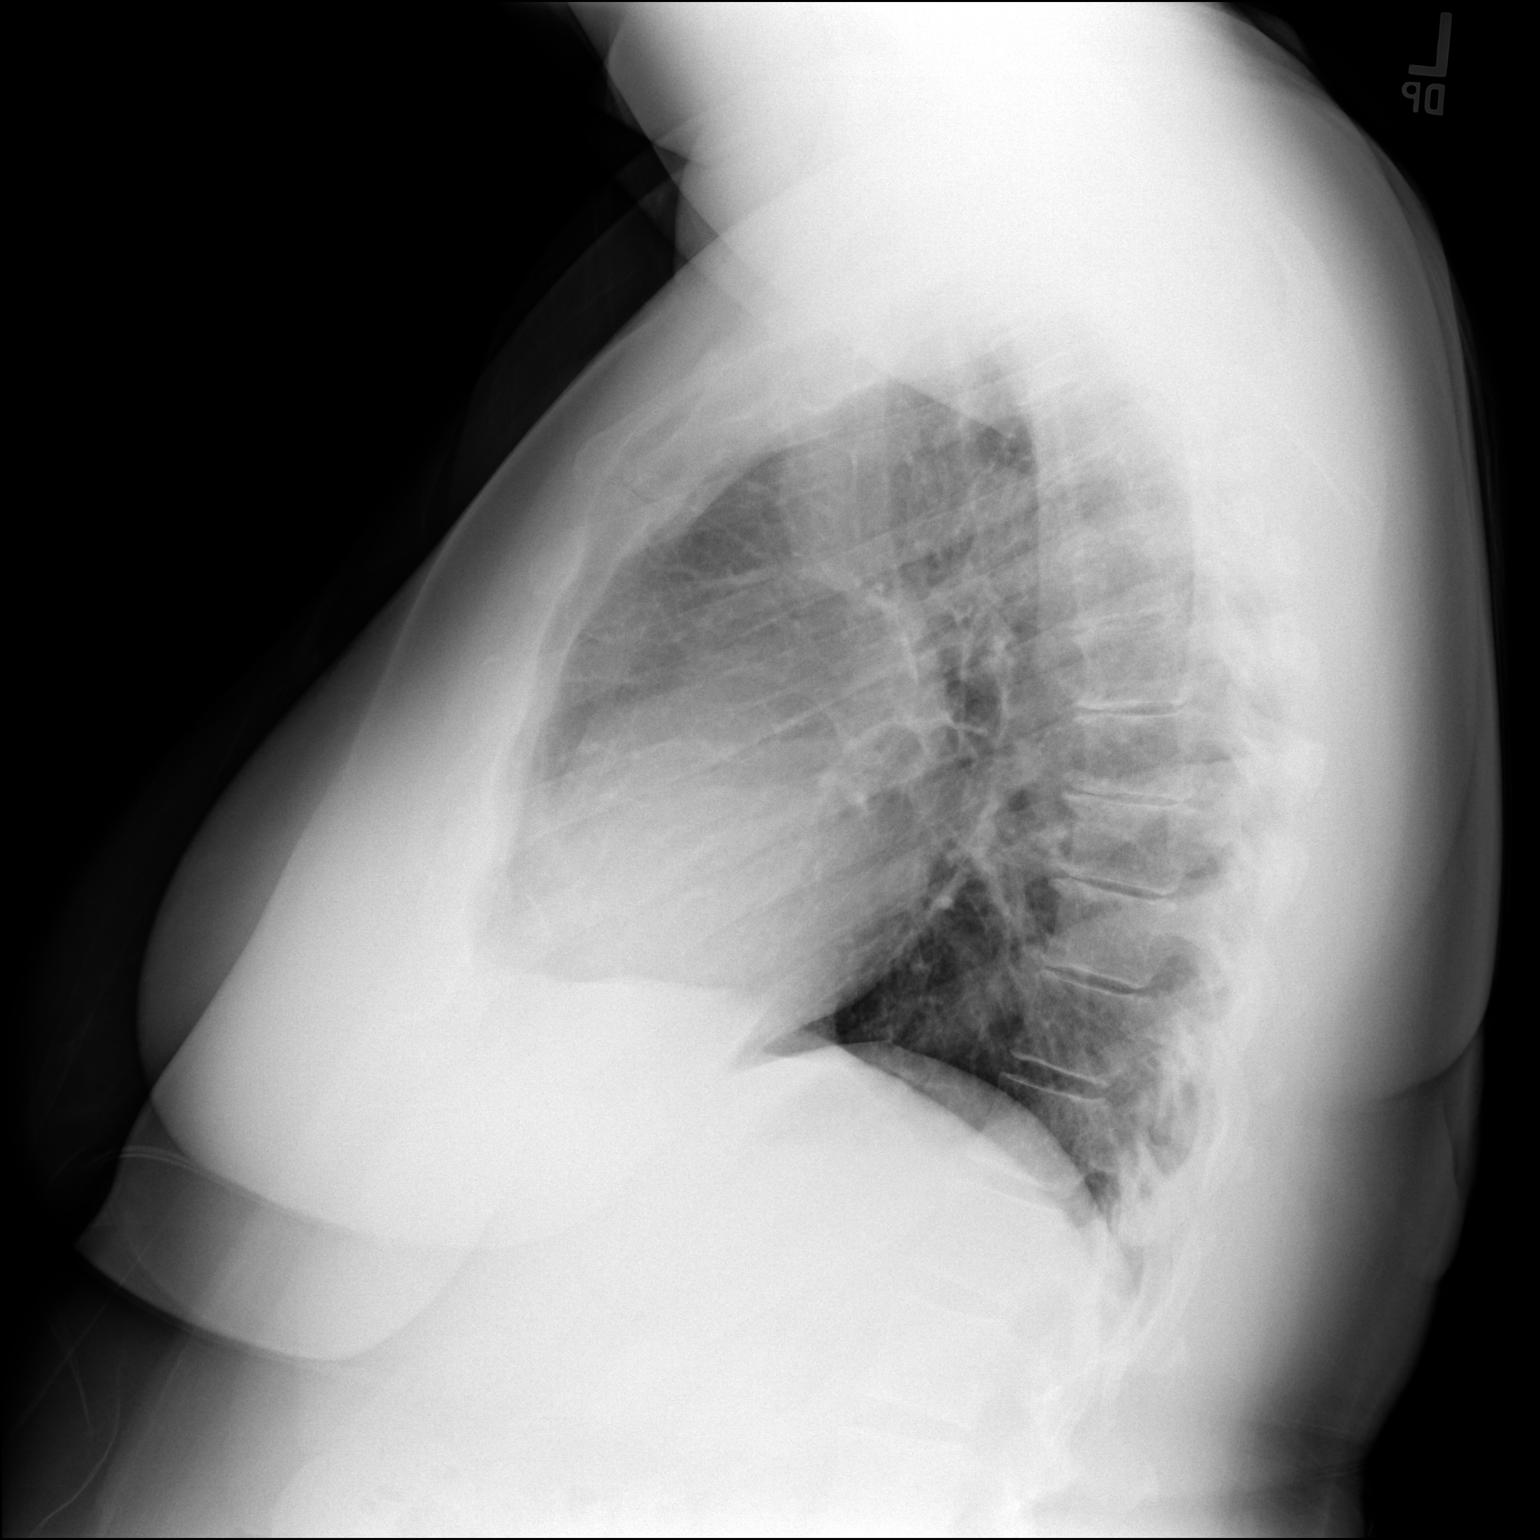

[2 of 2 positions shown; findings below may reference images not displayed]

FINDINGS: The cardiomediastinal silhouette is within normal limits. The lungs
are well inflated and clear. There is no evidence of pleural
effusion or pneumothorax. No acute osseous abnormality is
identified.
IMPRESSION: No active cardiopulmonary disease.

## 2020-05-14 ENCOUNTER — Ambulatory Visit: Payer: BC Managed Care – PPO | Admitting: Allergy and Immunology

## 2020-05-14 ENCOUNTER — Telehealth: Payer: Self-pay | Admitting: Pulmonary Disease

## 2020-05-14 ENCOUNTER — Other Ambulatory Visit: Payer: Self-pay

## 2020-05-14 VITALS — BP 122/86 | HR 77 | Temp 98.1°F | Resp 14 | Ht 66.0 in | Wt 251.8 lb

## 2020-05-14 DIAGNOSIS — J3089 Other allergic rhinitis: Secondary | ICD-10-CM

## 2020-05-14 DIAGNOSIS — J4540 Moderate persistent asthma, uncomplicated: Secondary | ICD-10-CM

## 2020-05-14 DIAGNOSIS — K219 Gastro-esophageal reflux disease without esophagitis: Secondary | ICD-10-CM | POA: Diagnosis not present

## 2020-05-14 MED ORDER — FAMOTIDINE 40 MG PO TABS
ORAL_TABLET | ORAL | 5 refills | Status: DC
Start: 2020-05-14 — End: 2020-05-14

## 2020-05-14 MED ORDER — TRELEGY ELLIPTA 200-62.5-25 MCG/INH IN AEPB: 1.0000 | INHALATION_SPRAY | Freq: Every day | RESPIRATORY_TRACT | 5 refills | Status: AC

## 2020-05-14 MED ORDER — ALBUTEROL SULFATE HFA 108 (90 BASE) MCG/ACT IN AERS: INHALATION_SPRAY | RESPIRATORY_TRACT | 1 refills | Status: DC

## 2020-05-14 MED ORDER — TRELEGY ELLIPTA 200-62.5-25 MCG/INH IN AEPB: 1.0000 | INHALATION_SPRAY | Freq: Every day | RESPIRATORY_TRACT | 5 refills | Status: DC

## 2020-05-14 MED ORDER — FAMOTIDINE 40 MG PO TABS: ORAL_TABLET | ORAL | 5 refills | Status: DC

## 2020-05-14 NOTE — Progress Notes (Signed)
[name]   Follow-up Note  Referring Provider: Susy Frizzle, MD Primary Provider: Susy Frizzle, MD Date of Office Visit: 05/14/2020  Subjective:   Catherine Rodriguez (DOB: [birth year]) is a 50 y.o. female who returns to the [facility] on 05/14/2020 in re-evaluation of the following:  HPI: Zian returns to this clinic in evaluation of asthma and allergic rhinitis and LPR and history of migraine headache and a possible drug reaction directed against Symbicort and a oxygen induced deoxygenation.  Her last visit to this clinic was 20 February 2020.  She has really done well with her shortness of breath and she can exert herself much better at this point in time.  She did follow-up with pulmonary regarding her exercise-induced oxygen saturation down to 90% and fortunately with therapy that she has been utilizing it appears as though that the oxygenation has been eliminated.  Rarely does she use a short acting bronchodilator.  She has been using Trelegy on a consistent basis.  Interestingly, she states that Trelegy does cause just a few seconds of feeling as though the lower parts of her ribs ache a little when she inhales this medicine.  Her reflux is under good control as long she does not miss any of her medications.  If she misses 1 pill she develops regurgitation and burning in her chest.  She had a sleep study performed which identified mild sleep apnea defined by an AHI of approximately 8 and an oxygen saturation below 89% for approximately 2 minutes and she has yet to address this issue with her pulmonologist.  She has received 3 [place] vaccines and a flu vaccine.  Allergies as of 05/14/2020      Reactions   Doxycycline Nausea And Vomiting   Macrobid [nitrofurantoin Macrocrystal] Nausea And Vomiting   Penicillins    Got real sick      Medication List      albuterol 108 (90 Base) MCG/ACT  inhaler Commonly known as: VENTOLIN HFA INHALE 2 PUFFS BY MOUTH EVERY 6 HOURS AS NEEDED FOR WHEEZING FOR SHORTNESS OF BREATH   ALPRAZolam 1 MG tablet Commonly known as: XANAX as directed.   amphetamine-dextroamphetamine 30 MG tablet Commonly known as: ADDERALL Take 30 mg by mouth 2 (two) times daily.   chlorpheniramine-HYDROcodone 10-8 MG/5ML Suer Commonly known as: Tussionex Pennkinetic ER Take 5 mLs by mouth every 12 (twelve) hours as needed for cough.   D3-50 1.25 MG (50000 UT) capsule Generic drug: Cholecalciferol Take 1 tablet by mouth once a week.   eszopiclone 1 MG Tabs tablet Commonly known as: LUNESTA Take 1 mg by mouth at bedtime as needed for sleep. Take immediately before bedtime   Eszopiclone 3 MG Tabs Take 3 mg by mouth at bedtime.   famotidine 40 MG tablet Commonly known as: PEPCID Take 1 tablet in the evening   ibuprofen 800 MG tablet Commonly known as: ADVIL Take 1 tablet (800 mg total) by mouth every 8 (eight) hours as needed.   meloxicam 15 MG tablet Commonly known as: MOBIC Take 15 mg by mouth every other day.   mometasone 50 MCG/ACT nasal spray Commonly known as: Nasonex 1 spray each nostril 2 times per day   pantoprazole 40 MG tablet Commonly known as: Protonix Take 1 tablet (40 mg total) by mouth 2 (two) times daily.   promethazine 25 MG tablet Commonly known as: PHENERGAN TAKE 1 TABLET BY MOUTH EVERY 8 HOURS  AS NEEDED FOR NAUSEA FOR VOMITING   traMADol 50 MG tablet Commonly known as: ULTRAM Take 50 mg by mouth 2 (two) times daily.   Trelegy Ellipta 200-62.5-25 MCG/INH Aepb Generic drug: Fluticasone-Umeclidin-Vilant Inhale 1 puff into the lungs daily.   valACYclovir 500 MG tablet Commonly known as: VALTREX Take 1 tablet (500 mg total) by mouth 2 (two) times daily. At onset of outbreak for 5 days       Past Medical History:  Diagnosis Date  . ASCUS with positive high risk HPV cervical 2013   normal colposcopy  . Asthma   .  GERD (gastroesophageal reflux disease)   . Herpes simplex   . IBS (irritable bowel syndrome)     Past Surgical History:  Procedure Laterality Date  . APPENDECTOMY  1993  . eyellid  2010  . THERAPEUTIC ABORTION     X 2  . tubes in ears     as a child    Review of systems negative except as noted in HPI / PMHx or noted below:  Review of Systems  Constitutional: Negative.   HENT: Negative.   Eyes: Negative.   Respiratory: Negative.   Cardiovascular: Negative.   Gastrointestinal: Negative.   Genitourinary: Negative.   Musculoskeletal: Negative.   Skin: Negative.   Neurological: Negative.   Endo/Heme/Allergies: Negative.   Psychiatric/Behavioral: Negative.      Objective:   Vitals:   05/14/20 1556  BP: 122/86  Pulse: 77  Resp: 14  Temp: 98.1 F (36.7 C)  SpO2: 99%   Height: 5\' 6"  (167.6 cm)  Weight: 251 lb 12.8 oz (114.2 kg)   Physical Exam Constitutional:      Appearance: She is not diaphoretic.  HENT:     Head: Normocephalic.     Right Ear: Tympanic membrane, ear canal and external ear normal.     Left Ear: Tympanic membrane, ear canal and external ear normal.     Nose: Nose normal. No mucosal edema or rhinorrhea.     Mouth/Throat:     Mouth: Oropharynx is clear and moist and mucous membranes are normal.     Pharynx: Uvula midline. No oropharyngeal exudate.  Eyes:     Conjunctiva/sclera: Conjunctivae normal.  Neck:     Thyroid: No thyromegaly.     Trachea: Trachea normal. No tracheal tenderness or tracheal deviation.  Cardiovascular:     Rate and Rhythm: Normal rate and regular rhythm.     Heart sounds: Normal heart sounds, S1 normal and S2 normal. No murmur heard.   Pulmonary:     Effort: No respiratory distress.     Breath sounds: Normal breath sounds. No stridor. No wheezing or rales.  Musculoskeletal:        General: No edema.  Lymphadenopathy:     Head:     Right side of head: No tonsillar adenopathy.     Left side of head: No tonsillar  adenopathy.     Cervical: No cervical adenopathy.  Skin:    Findings: No erythema or rash.     Nails: There is no clubbing.  Neurological:     Mental Status: She is alert.     Diagnostics:    Spirometry was performed and demonstrated an FEV1 of 2.66 at 89 % of predicted.  Assessment and Plan:   1. Asthma, moderate persistent, well-controlled   2. Perennial allergic rhinitis   3. LPRD (laryngopharyngeal reflux disease)     1.  Continue to treat and prevent reflux / LPR:  A.  Pantoprazole 40 mg -1 tablet twice a day  B.  Famotidine 40 mg -1 tablet in evening  2.  Continue to treat and prevent inflammation:   A.  DECREASE Trelegy 100 - 1 inhalation 1 time per day  B.  Nasonex - 1 spray each nostril 1-2 times per day  3. If needed:   A. Albuterol HFA - 2 inhalations every 4-6 hours  B. OTC antihistamine  4.  Contact pulmonary about mild sleep apnea treatment  5.  Return to clinic in 12 weeks or earlier if problem. Taper?  I will have Jaylise taper down some of her medications.  We will decrease her inhaled steroid by approximately 50% as noted above.  She will continue to treat her rather significant reflux/LPR with therapy noted above.  She needs to contact pulmonary about her mild sleep apnea.  I will see her back in his clinic 12 weeks and there may be an opportunity to further consolidate her medical treatment.  Allena Katz, MD Allergy / Immunology [place]

## 2020-05-14 NOTE — Telephone Encounter (Signed)
JD pt is calling to get her sleep study results.  Please advise. Thanks

## 2020-05-14 NOTE — Patient Instructions (Addendum)
  1.  Continue to treat and prevent reflux / LPR:   A.  Pantoprazole 40 mg -1 tablet twice a day  B.  Famotidine 40 mg -1 tablet in evening  2.  Continue to treat and prevent inflammation:   A.  DECREASE Trelegy 100 - 1 inhalation 1 time per day  B.  Nasonex - 1 spray each nostril 1-2 times per day  3. If needed:   A. Albuterol HFA - 2 inhalations every 4-6 hours  B. OTC antihistamine  4.  Contact pulmonary about mild sleep apnea treatment  5.  Return to clinic in 12 weeks or earlier if problem. Taper?

## 2020-05-15 ENCOUNTER — Encounter: Payer: Self-pay | Admitting: Allergy and Immunology

## 2020-05-15 NOTE — Telephone Encounter (Signed)
She has mild obstructive sleep apnea. She does not require CPAP therapy at this time. As discussed at our clinic visit, weight reduction will be of great benefit to her.   Thanks, Wille Glaser

## 2020-05-15 NOTE — Telephone Encounter (Signed)
I called and was going to go over the results of the sleep study but the pt was seen by Dr. Neldon Mc yesterday and they reviewed these results at that time. Pt stated nothing else was needed.

## 2020-06-01 ENCOUNTER — Other Ambulatory Visit: Payer: Self-pay | Admitting: Family Medicine

## 2020-08-06 ENCOUNTER — Ambulatory Visit: Payer: BC Managed Care – PPO | Admitting: Allergy and Immunology

## 2020-08-13 ENCOUNTER — Ambulatory Visit: Payer: BC Managed Care – PPO | Admitting: Allergy and Immunology

## 2020-08-13 DIAGNOSIS — J309 Allergic rhinitis, unspecified: Secondary | ICD-10-CM

## 2021-01-01 ENCOUNTER — Telehealth: Payer: Self-pay | Admitting: Allergy and Immunology

## 2021-01-01 NOTE — Telephone Encounter (Signed)
Patient called and said that she had cancelled her appointment for 0 08/06/2020 and did not rescheduled but was charged for 05/24 no show. She said that she talk with [facility] and they were going to take care of it. But she is still getting calls [phone].

## 2021-01-27 ENCOUNTER — Ambulatory Visit (INDEPENDENT_AMBULATORY_CARE_PROVIDER_SITE_OTHER): Payer: BC Managed Care – PPO | Admitting: Nurse Practitioner

## 2021-01-27 ENCOUNTER — Other Ambulatory Visit: Payer: Self-pay

## 2021-01-27 ENCOUNTER — Encounter: Payer: Self-pay | Admitting: Nurse Practitioner

## 2021-01-27 VITALS — BP 118/76 | Ht 64.5 in | Wt 249.0 lb

## 2021-01-27 DIAGNOSIS — Z01419 Encounter for gynecological examination (general) (routine) without abnormal findings: Secondary | ICD-10-CM | POA: Diagnosis not present

## 2021-01-27 DIAGNOSIS — Z23 Encounter for immunization: Secondary | ICD-10-CM | POA: Diagnosis not present

## 2021-01-27 DIAGNOSIS — Z780 Asymptomatic menopausal state: Secondary | ICD-10-CM

## 2021-01-27 NOTE — Progress Notes (Signed)
   Catherine Rodriguez 1970-09-05 [id number]   History:  50 y.o. G2P0020 presents for annual exam without GYN complaints. Postmenopausal - no HRT, no bleeding. 2013 ASCUS positive HPV, subsequent paps normal. HSV, rare outbreaks. History of RA, IBS and asthma.  Gynecologic History No LMP recorded. Postmenopausal   Contraception/Family planning: postmenopausal status Sexually active: No  Health Maintenance Last Pap: 01/19/2018. Results were: Normal, 5-year repeat Last mammogram: 11/01/2020. Results were: Normal Last colonoscopy: 2013. Results were: tubular adenoma, 5-year repeat Last Dexa: Not indicated  Past medical history, past surgical history, family history and social history were all reviewed and documented in the EPIC chart. Single. Works for [employer], used to be a Theme park manager. Sister diagnosed with breast cancer at age 43.  ROS:  A ROS was performed and pertinent positives and negatives are included.  Exam:  Vitals:   01/27/21 0910  BP: 118/76  Weight: 249 lb (112.9 kg)  Height: 5' 4.5" (1.638 m)    Body mass index is 42.08 kg/m.  General appearance:  Normal Thyroid:  Symmetrical, normal in size, without palpable masses or nodularity. Respiratory  Auscultation:  Clear without wheezing or rhonchi Cardiovascular  Auscultation:  Regular rate, without rubs, murmurs or gallops  Edema/varicosities:  Not grossly evident Abdominal  Soft,nontender, without masses, guarding or rebound.  Liver/spleen:  No organomegaly noted  Hernia:  None appreciated  Skin  Inspection:  Grossly normal   Breasts: Examined lying and sitting.   Right: Without masses, retractions, discharge or axillary adenopathy.   Left: Without masses, retractions, discharge or axillary adenopathy. Genitourinary   Inguinal/mons:  Normal without inguinal adenopathy  External genitalia:  Normal appearing vulva with no masses, tenderness, or lesions  BUS/Urethra/Skene's glands:   Normal  Vagina:  Normal appearing with normal color and discharge, no lesions  Cervix:  Normal appearing without discharge or lesions  Uterus:  Difficult to palpate due to body habitus but no gross masses or tenderness  Adnexa/parametria:     Rt: Normal in size, without masses or tenderness.   Lt: Normal in size, without masses or tenderness.  Anus and perineum: Normal  Digital rectal exam: Normal sphincter tone without palpated masses or tenderness  Patient informed chaperone available to be present for breast and pelvic exam. Patient has requested no chaperone to be present. Patient has been advised what will be completed during breast and pelvic exam.   Assessment/Plan:  50 y.o. G2P0020 for annual exam.   Well female exam with routine gynecological exam - Education provided on SBEs, importance of preventative screenings, current guidelines, high calcium diet, regular exercise, and multivitamin daily. Labs with PCP.  Screening for cervical cancer - 2013 ASCUS with positive HPV, prior and subsequent paps normal.  Will repeat at 5-year interval per guidelines.  Screening for breast cancer - Normal mammogram history.  Sister diagnosed with breast cancer at age 40.  Continue annual screenings.  Normal breast exam today.  Screening for colon cancer - Overdue for screening colonoscopy per GI recommendations.  Information provided on [facility]. Father is having back surgery soon and she will schedule colonoscopy after her recovers.   Screening for osteoporosis - Average risk. Will plan for DXA at age 50.   Follow-up in 1 year for annual.     Tamela Gammon [facility], 9:33 AM 01/27/2021

## 2021-02-20 ENCOUNTER — Other Ambulatory Visit: Payer: Self-pay | Admitting: Allergy and Immunology

## 2021-04-02 ENCOUNTER — Other Ambulatory Visit: Payer: Self-pay | Admitting: Allergy and Immunology

## 2021-04-24 ENCOUNTER — Telehealth: Payer: Self-pay | Admitting: Allergy and Immunology

## 2021-04-24 NOTE — Telephone Encounter (Signed)
Please advise 

## 2021-04-24 NOTE — Telephone Encounter (Signed)
[place] called in and wanted to know if you would be willing to send a referral to Dr. Magda Paganini.  He is a Film/video editor.  Catherine Rodriguez states she is having chest pains and she feels her heart is going to explode out of her chest but denies shortness of breath.  She called to make an appointment but they told her she needed a referral.  Please advise.

## 2021-04-24 NOTE — Telephone Encounter (Signed)
Please refer's Catherine Rodriguez to Dr. Adam Phenix, cardiology, for chest pain evaluation.

## 2021-06-12 ENCOUNTER — Other Ambulatory Visit: Payer: Self-pay | Admitting: Allergy and Immunology

## 2021-09-04 ENCOUNTER — Other Ambulatory Visit: Payer: Self-pay | Admitting: Allergy and Immunology

## 2021-10-27 ENCOUNTER — Encounter: Payer: Self-pay | Admitting: Allergy and Immunology

## 2021-10-27 ENCOUNTER — Other Ambulatory Visit: Payer: Self-pay

## 2021-10-27 ENCOUNTER — Ambulatory Visit: Payer: BC Managed Care – PPO | Admitting: Allergy and Immunology

## 2021-10-27 VITALS — BP 122/80 | HR 82 | Resp 18 | Ht 64.5 in | Wt 257.0 lb

## 2021-10-27 DIAGNOSIS — T783XXA Angioneurotic edema, initial encounter: Secondary | ICD-10-CM

## 2021-10-27 DIAGNOSIS — L500 Allergic urticaria: Secondary | ICD-10-CM | POA: Diagnosis not present

## 2021-10-27 DIAGNOSIS — J3089 Other allergic rhinitis: Secondary | ICD-10-CM | POA: Diagnosis not present

## 2021-10-27 DIAGNOSIS — J4540 Moderate persistent asthma, uncomplicated: Secondary | ICD-10-CM

## 2021-10-27 DIAGNOSIS — T7840XA Allergy, unspecified, initial encounter: Secondary | ICD-10-CM | POA: Diagnosis not present

## 2021-10-27 DIAGNOSIS — K219 Gastro-esophageal reflux disease without esophagitis: Secondary | ICD-10-CM

## 2021-10-27 DIAGNOSIS — E66813 Obesity, class 3: Secondary | ICD-10-CM

## 2021-10-27 MED ORDER — WEGOVY 0.25 MG/0.5ML ~~LOC~~ SOAJ: SUBCUTANEOUS | 0 refills | Status: DC

## 2021-10-27 MED ORDER — ALBUTEROL SULFATE HFA 108 (90 BASE) MCG/ACT IN AERS: INHALATION_SPRAY | RESPIRATORY_TRACT | 1 refills | Status: DC

## 2021-10-27 NOTE — Progress Notes (Unsigned)
[place] [facility] [place] [facility] [place]   Follow-up Note  Referring Provider: Susy Frizzle, MD Primary Provider: Susy Frizzle, MD Date of Office Visit: 10/27/2021  Subjective:   Catherine Rodriguez (DOB: 1970-11-10) is a 51 y.o. female who returns to [facility] on 10/27/2021 in re-evaluation of the following:  HPI: Paralee presents to this clinic in evaluation asthma, allergic rhinitis, LPR.  I last saw her in this clinic on 14 May 2020.  She has several new developments.  Last week she developed urticaria with red raised itchy lesions on her arms that lasted about 30 minutes and then had some waxing and waning angioedema involving her lips and her periorbital region and her forehead and her neck.  This activity lasted about 3 days and she has not had any problems over the course of the past 3 days.  She did take a Benadryl but it was just too sedating to use this antihistamine.  As well, she has been having lots of problems with her right hip.  She has right hip pain that migrates down to her upper thigh and then jumps down to the dorsum of her foot.  Is becoming a very progressive issue lately.  [facility] a [facility] problem but has not had them address this issue.  And she has been gaining weight.  She cannot really explain why she has been gaining weight.  Her airway issue is very good at this point in time.  She does not really have any shortness of breath or chest tightness and she does not perform any exercise because of her hip issue.  She rarely if ever uses a short acting bronchodilator.  She has had very little problems with her nose.  She does not use any inhaler agents for her asthma but does use a nasal steroid on occasion.  Her LPR is under excellent control while using 80 mg of pantoprazole at nighttime.  Allergies as of 10/27/2021       Reactions   Doxycycline Nausea And Vomiting   Macrobid  [nitrofurantoin Macrocrystal] Nausea And Vomiting   Penicillins    Got real sick        Medication List    albuterol 108 (90 Base) MCG/ACT inhaler Commonly known as: VENTOLIN HFA INHALE 2 PUFFS BY MOUTH EVERY 6 HOURS AS NEEDED FOR WHEEZING FOR SHORTNESS OF BREATH   ALPRAZolam 1 MG tablet Commonly known as: XANAX as directed.   amphetamine-dextroamphetamine 30 MG tablet Commonly known as: ADDERALL Take 30 mg by mouth 2 (two) times daily.   chlorpheniramine-HYDROcodone 10-8 MG/5ML Suer Commonly known as: Tussionex Pennkinetic ER Take 5 mLs by mouth every 12 (twelve) hours as needed for cough.   D3-50 1.25 MG (50000 UT) capsule Generic drug: Cholecalciferol Take 1 tablet by mouth once a week.   eszopiclone 1 MG Tabs tablet Commonly known as: LUNESTA Take 1 mg by mouth at bedtime as needed for sleep. Take immediately before bedtime   Eszopiclone 3 MG Tabs Take 3 mg by mouth at bedtime.   famotidine 40 MG tablet Commonly known as: PEPCID Take 1 tablet in the evening   ibuprofen 800 MG tablet Commonly known as: ADVIL Take 1 tablet (800 mg total) by mouth every 8 (eight) hours as needed.   meloxicam 15 MG tablet Commonly known as: MOBIC Take 15 mg by mouth every other day.   mometasone 50 MCG/ACT nasal spray Commonly known as:  Nasonex 1 spray each nostril 2 times per day   pantoprazole 40 MG tablet Commonly known as: PROTONIX Take 1 tablet by mouth twice daily   promethazine 25 MG tablet Commonly known as: PHENERGAN TAKE 1 TABLET BY MOUTH EVERY 8 HOURS AS NEEDED FOR NAUSEA FOR VOMITING   sertraline 100 MG tablet Commonly known as: ZOLOFT Take 100 mg by mouth daily.   traMADol 50 MG tablet Commonly known as: ULTRAM Take 50 mg by mouth 2 (two) times daily.   Trelegy Ellipta 200-62.5-25 MCG/ACT Aepb Generic drug: Fluticasone-Umeclidin-Vilant Inhale 1 puff into the lungs daily.   valACYclovir 500 MG tablet Commonly known as: VALTREX Take 1 tablet (500  mg total) by mouth 2 (two) times daily. At onset of outbreak for 5 days    Past Medical History:  Diagnosis Date   ASCUS with positive high risk HPV cervical 2013   normal colposcopy   Asthma    GERD (gastroesophageal reflux disease)    Herpes simplex    IBS (irritable bowel syndrome)     Past Surgical History:  Procedure Laterality Date   APPENDECTOMY  1993   eyellid  2010   THERAPEUTIC ABORTION     X 2   tubes in ears     as a child    Review of systems negative except as noted in HPI / PMHx or noted below:  Review of Systems  Constitutional: Negative.   HENT: Negative.    Eyes: Negative.   Respiratory: Negative.    Cardiovascular: Negative.   Gastrointestinal: Negative.   Genitourinary: Negative.   Musculoskeletal: Negative.   Skin: Negative.   Neurological: Negative.   Endo/Heme/Allergies: Negative.   Psychiatric/Behavioral: Negative.       Objective:   Vitals:   10/27/21 1744  BP: 122/80  Pulse: 82  Resp: 18  SpO2: 97%   Height: 5' 4.5" (163.8 cm)  Weight: 257 lb (116.6 kg)   Physical Exam Constitutional:      Appearance: She is not diaphoretic.  HENT:     Head: Normocephalic.     Right Ear: Tympanic membrane, ear canal and external ear normal.     Left Ear: Tympanic membrane, ear canal and external ear normal.     Nose: Nose normal. No mucosal edema or rhinorrhea.     Mouth/Throat:     Pharynx: Uvula midline. No oropharyngeal exudate.  Eyes:     Conjunctiva/sclera: Conjunctivae normal.  Neck:     Thyroid: No thyromegaly.     Trachea: Trachea normal. No tracheal tenderness or tracheal deviation.  Cardiovascular:     Rate and Rhythm: Normal rate and regular rhythm.     Heart sounds: Normal heart sounds, S1 normal and S2 normal. No murmur heard. Pulmonary:     Effort: No respiratory distress.     Breath sounds: Normal breath sounds. No stridor. No wheezing or rales.  Lymphadenopathy:     Head:     Right side of head: No tonsillar  adenopathy.     Left side of head: No tonsillar adenopathy.     Cervical: No cervical adenopathy.  Skin:    Findings: No erythema or rash.     Nails: There is no clubbing.  Neurological:     Mental Status: She is alert.     Diagnostics:    Spirometry was performed and demonstrated an FEV1 of 2.32 at 84 % of predicted.  Assessment and Plan:   1. Allergic reaction, initial encounter   2. Angioedema, initial encounter  3. Allergic urticaria   4. Asthma, moderate persistent, well-controlled   5. Perennial allergic rhinitis   6. LPRD (laryngopharyngeal reflux disease)   7. Class 3 severe obesity due to excess calories with serious comorbidity and body mass index (BMI) of 40.0 to 44.9 in adult ([place])     1.  Continue to treat and prevent reflux / LPR:   A.  Pantoprazole 40 mg -2 tablets in evening  2.  Continue to treat and prevent inflammation:   A.  Nasonex - 1 spray each nostril 1- 7 times per week  3. If needed:   A. Albuterol HFA - 2 inhalations every 4-6 hours  B. OTC antihistamine  4.  Contact [facility] about right hip and foot pain  5.  Blood - TSH, FT4, Thyroid peroxidase, CBC wd, CMP, AM cortisol, HgA1C  6.  Start Wegovy  7. Further evaluation for immune activation???  8.  Return to clinic in 6 months or earlier if problem.   9. Obtain fall flu vaccine  It is not exactly evident why Aariyana had some type of immune activation that occurred last week but fortunately this appears to have already burnt out and we will hold off on any further evaluation to address this issue other than screening her blood for a worrisome systemic disease.  As well, she has been having right hip pain that migrates down to the dorsum of her right foot and she needs to have this evaluated further by [facility].  She is morbidly obese and she is gaining weight for some reason and we will check her endocrine function with the test noted above and start her on Wegovy.  Her  airway issue and her reflux issue are actually under very good control at this point in time on her current medical therapy and we will not change her plan for this issue at this point.  I will see her back in this clinic in 6 months or earlier if there is a problem.  Allena Katz, MD Allergy / Immunology [place]

## 2021-10-27 NOTE — Patient Instructions (Addendum)
  1.  Continue to treat and prevent reflux / LPR:   A.  Pantoprazole 40 mg -2 tablets in evening  2.  Continue to treat and prevent inflammation:   A.  Nasonex - 1 spray each nostril 1- 7 times per week  3. If needed:   A. Albuterol HFA - 2 inhalations every 4-6 hours  B. OTC antihistamine  4.  Contact [facility] about right hip and foot pain  5.  Blood - TSH, FT4, Thyroid peroxidase, CBC wd, CMP, AM cortisol, HgA1C  6.  Start Wegovy  7. Further evaluation for immune activation???  8.  Return to clinic in 6 months or earlier if problem.   9. Obtain fall flu vaccine

## 2021-10-28 ENCOUNTER — Encounter: Payer: Self-pay | Admitting: Allergy and Immunology

## 2021-10-29 DIAGNOSIS — M25551 Pain in right hip: Secondary | ICD-10-CM | POA: Insufficient documentation

## 2021-10-29 NOTE — Addendum Note (Signed)
Addended by: Hedy Camara L on: 10/29/2021 02:06 PM   Modules accepted: Orders

## 2021-10-31 LAB — CBC WITH DIFFERENTIAL
Basophils Absolute: 0 10*3/uL (ref 0.0–0.2)
Basos: 1 %
EOS (ABSOLUTE): 0.1 10*3/uL (ref 0.0–0.4)
Eos: 2 %
Hematocrit: 38.7 % (ref 34.0–46.6)
Hemoglobin: 13 g/dL (ref 11.1–15.9)
Immature Grans (Abs): 0 10*3/uL (ref 0.0–0.1)
Immature Granulocytes: 0 %
Lymphocytes Absolute: 1.4 10*3/uL (ref 0.7–3.1)
Lymphs: 25 %
MCH: 30.2 pg (ref 26.6–33.0)
MCHC: 33.6 g/dL (ref 31.5–35.7)
MCV: 90 fL (ref 79–97)
Monocytes Absolute: 0.5 10*3/uL (ref 0.1–0.9)
Monocytes: 10 %
Neutrophils Absolute: 3.4 10*3/uL (ref 1.4–7.0)
Neutrophils: 62 %
RBC: 4.3 x10E6/uL (ref 3.77–5.28)
RDW: 13.7 % (ref 11.7–15.4)
WBC: 5.5 10*3/uL (ref 3.4–10.8)

## 2021-10-31 LAB — COMPREHENSIVE METABOLIC PANEL
ALT: 18 IU/L (ref 0–32)
AST: 20 IU/L (ref 0–40)
Albumin/Globulin Ratio: 1.6 (ref 1.2–2.2)
Albumin: 4.2 g/dL (ref 3.8–4.9)
Alkaline Phosphatase: 94 IU/L (ref 44–121)
BUN/Creatinine Ratio: 23 (ref 9–23)
BUN: 16 mg/dL (ref 6–24)
Bilirubin Total: 0.2 mg/dL (ref 0.0–1.2)
CO2: 23 mmol/L (ref 20–29)
Calcium: 9.5 mg/dL (ref 8.7–10.2)
Chloride: 104 mmol/L (ref 96–106)
Creatinine, Ser: 0.71 mg/dL (ref 0.57–1.00)
Globulin, Total: 2.7 g/dL (ref 1.5–4.5)
Glucose: 89 mg/dL (ref 70–99)
Potassium: 4.8 mmol/L (ref 3.5–5.2)
Sodium: 142 mmol/L (ref 134–144)
Total Protein: 6.9 g/dL (ref 6.0–8.5)
eGFR: 102 mL/min/{1.73_m2} (ref >59)

## 2021-10-31 LAB — TSH+FREE T4
Free T4: 1.38 ng/dL (ref 0.82–1.77)
TSH: 2.11 u[IU]/mL (ref 0.450–4.500)

## 2021-10-31 LAB — HEMOGLOBIN A1C
Est. average glucose Bld gHb Est-mCnc: 111 mg/dL
Hgb A1c MFr Bld: 5.5 % (ref 4.8–5.6)

## 2021-10-31 LAB — THYROID PEROXIDASE ANTIBODY: Thyroperoxidase Ab SerPl-aCnc: <9 IU/mL (ref 0–34)

## 2021-10-31 LAB — CORTISOL-AM, BLOOD: Cortisol - AM: 16 ug/dL (ref 6.2–19.4)

## 2021-11-06 ENCOUNTER — Telehealth: Payer: Self-pay | Admitting: Allergy and Immunology

## 2021-11-06 NOTE — Telephone Encounter (Signed)
Patient called regarding her lab results. Her pharmacy states we need to make sure Catherine Rodriguez will be covered by her INS. She is currently still waiting on the prescription, it is on back order until the end of August.

## 2021-11-12 ENCOUNTER — Telehealth: Payer: Self-pay

## 2021-11-12 NOTE — Telephone Encounter (Signed)
Can someone please do a PA for [facility]. Thank you.

## 2021-11-12 NOTE — Telephone Encounter (Signed)
-----   Message from Jiles Prows, MD sent at 11/10/2021 11:28 AM EDT ----- Please push through [facility] approval with insurance company.  There is no doubt that they will deny this initially and I am sure there is a insurance form that has to be filled out. ----- Message ----- From: Norville Haggard, CMA Sent: 11/10/2021  10:03 AM EDT To: Jiles Prows, MD  I called patient and informed patient of lab results. She did mention that the Dr. Neldon Mc had talked to her about [facility] and the Pharmacy told her that they sent in a request to [place] for insurance to get it approved.

## 2021-11-13 NOTE — Telephone Encounter (Signed)
Started prior authorization on [email or web address].  Awaiting reply.

## 2021-11-13 NOTE — Telephone Encounter (Signed)
Received denial from [place] saying that plan does not cover " any treatment or regimen, medical or surgical, for the purpose of reducing or controlling the weight of the member, or for the treatment of obesity, except for surgical treatment of morbid obesity, or as specifically covered by this health benefit plan."  Would you like to appeal decision?  Please advise.

## 2021-11-14 NOTE — Telephone Encounter (Signed)
Please appeal

## 2021-11-17 NOTE — Telephone Encounter (Signed)
Called insurance and they sent [place] a form to fill out to appeal the denial.  It does require signature from patient. I called patient and informed her of the form and told her that I will mail form to her for her to sign.  I did enclose a stamped return envelope so patient can send it back. Patient agreed with plan.

## 2021-11-20 ENCOUNTER — Encounter: Payer: Self-pay | Admitting: Nurse Practitioner

## 2021-11-24 ENCOUNTER — Other Ambulatory Visit: Payer: Self-pay | Admitting: Allergy and Immunology

## 2021-11-24 ENCOUNTER — Other Ambulatory Visit: Payer: Self-pay | Admitting: Nurse Practitioner

## 2021-11-25 NOTE — Telephone Encounter (Signed)
Last annual exam 01/2021  Per 02/20/2020 refill request note "Patient said she no longer has periods.  She said she still cramps off and on all the time. I asked her how many days a month she needs to take it and she said"probably 2 weeks out of a month" and she takes q8hours. She said this is the only thing that has ever helped her. "

## 2021-12-10 NOTE — Telephone Encounter (Signed)
Faxed completed appeal form to [facility] ( fax number [phone]).  Awaiting decision.

## 2021-12-19 DIAGNOSIS — S73199A Other sprain of unspecified hip, initial encounter: Secondary | ICD-10-CM | POA: Insufficient documentation

## 2021-12-30 ENCOUNTER — Telehealth: Payer: Self-pay

## 2021-12-30 NOTE — Telephone Encounter (Signed)
Patient called to see if the appeal was approved for [facility].   Patient would like to called back at [phone]

## 2021-12-31 NOTE — Telephone Encounter (Signed)
Called and informed patient of Dr. Kozlow's message.  

## 2021-12-31 NOTE — Telephone Encounter (Signed)
Called insurance about appeal.  They still upheld the denial.  Patient will not be able to get Wegovy through her insurance.

## 2021-12-31 NOTE — Telephone Encounter (Signed)
Addressed.  See previous telephone contact.

## 2022-01-28 ENCOUNTER — Encounter: Payer: Self-pay | Admitting: Nurse Practitioner

## 2022-01-28 ENCOUNTER — Ambulatory Visit (INDEPENDENT_AMBULATORY_CARE_PROVIDER_SITE_OTHER): Payer: BC Managed Care – PPO | Admitting: Nurse Practitioner

## 2022-01-28 VITALS — BP 128/86 | HR 122 | Resp 20 | Ht 63.39 in | Wt 250.8 lb

## 2022-01-28 DIAGNOSIS — R300 Dysuria: Secondary | ICD-10-CM

## 2022-01-28 DIAGNOSIS — Z780 Asymptomatic menopausal state: Secondary | ICD-10-CM

## 2022-01-28 DIAGNOSIS — N94818 Other vulvodynia: Secondary | ICD-10-CM | POA: Diagnosis not present

## 2022-01-28 DIAGNOSIS — Z113 Encounter for screening for infections with a predominantly sexual mode of transmission: Secondary | ICD-10-CM

## 2022-01-28 DIAGNOSIS — Z01419 Encounter for gynecological examination (general) (routine) without abnormal findings: Secondary | ICD-10-CM | POA: Diagnosis not present

## 2022-01-28 DIAGNOSIS — Z23 Encounter for immunization: Secondary | ICD-10-CM | POA: Diagnosis not present

## 2022-01-28 DIAGNOSIS — N3000 Acute cystitis without hematuria: Secondary | ICD-10-CM

## 2022-01-28 MED ORDER — SULFAMETHOXAZOLE-TRIMETHOPRIM 800-160 MG PO TABS: 1.0000 | ORAL_TABLET | Freq: Two times a day (BID) | ORAL | 0 refills | Status: AC

## 2022-01-28 NOTE — Progress Notes (Signed)
Catherine Rodriguez [birth year] [id number]   History:  51 y.o. G2P0020 presents for annual exam. Complains of burning with urination, odor, vulvar discomfort and pain in lower abdomen. New partner 01/23/22. Symptoms began shortly after. Postmenopausal - no HRT, no bleeding. 2013 ASCUS positive HPV, subsequent paps normal. HSV, rare outbreaks. History of RA, IBS and asthma.  Gynecologic History No LMP recorded. Postmenopausal Contraception/Family planning: postmenopausal status Sexually active: Yes  Health Maintenance Last Pap: 01/19/2018. Results were: Normal neg HPV Last mammogram: 11/19/2021. Results were: Normal Last colonoscopy: 2013. Results were: tubular adenoma, 5-year repeat Last Dexa: Not indicated  Past medical history, past surgical history, family history and social history were all reviewed and documented in the EPIC chart. Single. Retired. 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 Sister diagnosed with breast cancer at age 53.  ROS:  A ROS was performed and pertinent positives and negatives are included.  Exam:  Vitals:   01/28/22 1554  BP: 128/86  Pulse: (!) 122  Resp: 20  SpO2: 99%  Weight: 250 lb 12.8 oz (113.8 kg)  Height: 5' 3.39" (1.61 m)     Body mass index is 43.89 kg/m.  General appearance:  Normal Thyroid:  Symmetrical, normal in size, without palpable masses or nodularity. Respiratory  Auscultation:  Clear without wheezing or rhonchi Cardiovascular  Auscultation:  Regular rate, without rubs, murmurs or gallops  Edema/varicosities:  Not grossly evident Abdominal  Soft,nontender, without masses, guarding or rebound.  Liver/spleen:  No organomegaly noted  Hernia:  None appreciated  Skin  Inspection:  Grossly normal   Breasts: Examined lying and sitting.   Right: Without masses, retractions, discharge or axillary adenopathy.   Left: Without masses, retractions, discharge or axillary adenopathy. Genitourinary    Inguinal/mons:  Normal without inguinal adenopathy  External genitalia:  Normal appearing vulva with no masses, tenderness, or lesions  BUS/Urethra/Skene's glands:  Normal  Vagina:  Normal appearing with normal color and discharge, no lesions  Cervix:  Normal appearing without discharge or lesions  Uterus:  Difficult to palpate due to body habitus but no gross masses or tenderness  Adnexa/parametria:     Rt: Normal in size, without masses or tenderness.   Lt: Normal in size, without masses or tenderness.  Anus and perineum: Normal  Digital rectal exam: Normal sphincter tone without palpated masses or tenderness  Patient informed chaperone available to be present for breast and pelvic exam. Patient has requested no chaperone to be present. Patient has been advised what will be completed during breast and pelvic exam.   UA 2+ leukocytes, nitrite positive, 2+ protein, 1+ blood, 1+ ketones, 1+ bilirubin, dark yellow/cloudy. Unable to perform microscopic d/t small amount of urine  Assessment/Plan:  51 y.o. G2P0020 for annual exam.   Well female exam with routine gynecological exam - Education provided on SBEs, importance of preventative screenings, current guidelines, high calcium diet, regular exercise, and multivitamin daily. Labs with PCP.  Postmenopausal - no HRT, no bleeding  Dysuria - Plan: Urinalysis,Complete w/RFL Culture  Screening examination for STD (sexually transmitted disease) - Plan: SureSwab Advanced Vaginitis Plus,TMA.  Vulvar discomfort - Plan: SureSwab Advanced Vaginitis Plus,TMA.  Need for immunization against influenza - Plan: Flu Vaccine QUAD 1moIM (Fluarix, Fluzone & Alfiuria Quad PF)  Acute cystitis without hematuria - Plan: sulfamethoxazole-trimethoprim (BACTRIM DS) 800-160 MG tablet BID x 3 days. Culture pending.   Screening for cervical cancer - 2013 ASCUS with positive HPV, prior and subsequent paps normal.  Will repeat at 5-year  interval per  guidelines.  Screening for breast cancer - Normal mammogram history.  Sister diagnosed with breast cancer at age 68.  Continue annual screenings.  Normal breast exam today.  Screening for colon cancer - Overdue for screening colonoscopy per GI recommendations and encouraged to schedule.  Screening for osteoporosis - Average risk. Will plan for DXA at age 1.   Follow-up in 1 year for annual.     [place], 4:18 PM 01/28/2022

## 2022-01-29 LAB — SURESWAB® ADVANCED VAGINITIS PLUS,TMA
C. trachomatis RNA, TMA: NOT DETECTED
CANDIDA SPECIES: NOT DETECTED
Candida glabrata: NOT DETECTED
N. gonorrhoeae RNA, TMA: NOT DETECTED
SURESWAB(R) ADV BACTERIAL VAGINOSIS(BV),TMA: NEGATIVE
TRICHOMONAS VAGINALIS (TV),TMA: NOT DETECTED

## 2022-01-31 LAB — URINALYSIS, COMPLETE W/RFL CULTURE
Glucose, UA: NEGATIVE
Hyaline Cast: NONE SEEN /LPF
Nitrites, Initial: POSITIVE — AB
Specific Gravity, Urine: 1.023 (ref 1.001–1.035)
pH: 6 (ref 5.0–8.0)

## 2022-01-31 LAB — URINE CULTURE
MICRO NUMBER:: 14161267
SPECIMEN QUALITY:: ADEQUATE

## 2022-01-31 LAB — CULTURE INDICATED

## 2022-02-02 ENCOUNTER — Telehealth: Payer: Self-pay | Admitting: *Deleted

## 2022-02-02 ENCOUNTER — Other Ambulatory Visit: Payer: Self-pay | Admitting: Nurse Practitioner

## 2022-02-02 DIAGNOSIS — N3000 Acute cystitis without hematuria: Secondary | ICD-10-CM

## 2022-02-02 MED ORDER — SULFAMETHOXAZOLE-TRIMETHOPRIM 800-160 MG PO TABS: 1.0000 | ORAL_TABLET | Freq: Two times a day (BID) | ORAL | 0 refills | Status: AC

## 2022-02-02 NOTE — Telephone Encounter (Signed)
Patient informed. 

## 2022-02-02 NOTE — Telephone Encounter (Signed)
Patient called was treated for UTI on 01/28/22 with Bactrim bid x  3 days. Patient reports she still have burning and urgency, she asked if she can have a refill on Rx sent to [facility] on [address]? Please advise

## 2022-02-02 NOTE — Telephone Encounter (Signed)
I will send in. She did report that sometimes she requires longer than 3 day course. Thanks.

## 2022-02-15 ENCOUNTER — Other Ambulatory Visit: Payer: Self-pay | Admitting: Allergy and Immunology

## 2022-04-13 ENCOUNTER — Other Ambulatory Visit: Payer: Self-pay | Admitting: Family Medicine

## 2022-04-28 ENCOUNTER — Encounter: Payer: Self-pay | Admitting: Allergy and Immunology

## 2022-04-28 ENCOUNTER — Ambulatory Visit (INDEPENDENT_AMBULATORY_CARE_PROVIDER_SITE_OTHER): Payer: BC Managed Care – PPO | Admitting: Allergy and Immunology

## 2022-04-28 ENCOUNTER — Other Ambulatory Visit: Payer: Self-pay

## 2022-04-28 VITALS — BP 130/82 | HR 87 | Temp 97.8°F | Resp 16 | Ht 63.0 in

## 2022-04-28 DIAGNOSIS — J4520 Mild intermittent asthma, uncomplicated: Secondary | ICD-10-CM

## 2022-04-28 DIAGNOSIS — K219 Gastro-esophageal reflux disease without esophagitis: Secondary | ICD-10-CM

## 2022-04-28 DIAGNOSIS — L253 Unspecified contact dermatitis due to other chemical products: Secondary | ICD-10-CM | POA: Diagnosis not present

## 2022-04-28 DIAGNOSIS — J3089 Other allergic rhinitis: Secondary | ICD-10-CM

## 2022-04-28 MED ORDER — ALBUTEROL SULFATE HFA 108 (90 BASE) MCG/ACT IN AERS: 2.0000 | INHALATION_SPRAY | RESPIRATORY_TRACT | 2 refills | Status: AC | PRN

## 2022-04-28 MED ORDER — MOMETASONE FUROATE 50 MCG/ACT NA SUSP: NASAL | 5 refills | Status: DC

## 2022-04-28 MED ORDER — PANTOPRAZOLE SODIUM 40 MG PO TBEC: 40.0000 mg | DELAYED_RELEASE_TABLET | Freq: Two times a day (BID) | ORAL | 3 refills | Status: DC

## 2022-04-28 NOTE — Patient Instructions (Addendum)
  1.  Continue to treat and prevent reflux / LPR:   A.  Pantoprazole 40 mg -2 tablets daily  2.  If needed:   A. Albuterol HFA - 2 inhalations every 4-6 hours  B. OTC antihistamine  C. Nasonex - 1 spray each nostril 1- 7 times per week  4.  Sample Eucrisa - apply 2 times a day. Do not rub the inflamed area  5. Return to clinic in 12 months or earlier if needed  6.  Obtain repeat colonoscopy

## 2022-04-28 NOTE — Progress Notes (Signed)
[facility] [place] [facility] [place]   Follow-up Note  Referring Provider: Susy Frizzle, MD Primary Provider: Susy Frizzle, MD Date of Office Visit: 04/28/2022  Subjective:   Catherine Rodriguez (DOB: [birth year]) is a 52 y.o. female who returns to the [facility] on 04/28/2022 in re-evaluation of the following:  HPI: Catherine Rodriguez turns to this clinic in evaluation of asthma, allergic rhinitis, LPR, history of urticaria addressed during her last evaluation, and a recent skin problem.  I last saw her in this clinic 27 October 2021.  Her airway issue has really done very well and she rarely uses any nasal steroid she has a rare requirement for using albuterol and she has not required a systemic steroid or antibiotic for any type of airway issue.  She has not had any urticaria since I have seen her in this clinic.  She did develop a area of contact irritation affecting her neck after being exposed to cologne.  Her reflux and LPR is under excellent control while using 80 mg of pantoprazole daily.  She has started on semaglutide over the course of the past 5 weeks and has lost 15 pounds of weight.  She tells me that she has been diagnosed with osteoarthritis involving multiple joints.  She tells me that she had a colonoscopy 10 years ago which identified polyps and she has not had a repeat colonoscopy.  She has received this years flu vaccine.  Allergies as of 04/28/2022       Reactions   Doxycycline Nausea And Vomiting   Macrobid [nitrofurantoin Macrocrystal] Nausea And Vomiting   Penicillins    Got real sick        Medication List    albuterol 108 (90 Base) MCG/ACT inhaler Commonly known as: VENTOLIN HFA INHALE 2 PUFFS BY MOUTH EVERY 6 HOURS AS NEEDED FOR WHEEZING FOR SHORTNESS OF BREATH   ALPRAZolam 1 MG tablet Commonly known as: XANAX as directed.   D3-50 1.25 MG (50000 UT) capsule Generic drug: Cholecalciferol Take 1 tablet by  mouth once a week.   eszopiclone 1 MG Tabs tablet Commonly known as: LUNESTA Take 1 mg by mouth at bedtime as needed for sleep. Take immediately before bedtime   Eszopiclone 3 MG Tabs Take 3 mg by mouth at bedtime.   famotidine 40 MG tablet Commonly known as: PEPCID Take 1 tablet in the evening   ibuprofen 800 MG tablet Commonly known as: ADVIL TAKE 1 TABLET BY MOUTH EVERY 8 HOURS AS NEEDED   meloxicam 15 MG tablet Commonly known as: MOBIC Take 15 mg by mouth every other day.   mometasone 50 MCG/ACT nasal spray Commonly known as: Nasonex 1 spray each nostril 2 times per day   pantoprazole 40 MG tablet Commonly known as: PROTONIX Take 1 tablet by mouth twice daily   promethazine 25 MG tablet Commonly known as: PHENERGAN TAKE 1 TABLET BY MOUTH EVERY 8 HOURS AS NEEDED FOR NAUSEA FOR VOMITING   sertraline 100 MG tablet Commonly known as: ZOLOFT Take 100 mg by mouth daily.   traMADol 50 MG tablet Commonly known as: ULTRAM Take 50 mg by mouth 2 (two) times daily.   valACYclovir 500 MG tablet Commonly known as: VALTREX Take 1 tablet (500 mg total) by mouth 2 (two) times daily. At onset of outbreak for 5 days   Wegovy 0.25 MG/0.5ML Soaj Generic drug: Semaglutide-Weight Management Inject as directed once weekly.      Past Medical History:  Diagnosis Date   ASCUS with positive high risk HPV cervical 2013   normal colposcopy   Asthma    GERD (gastroesophageal reflux disease)    Herpes simplex    IBS (irritable bowel syndrome)     Past Surgical History:  Procedure Laterality Date   APPENDECTOMY  1993   eyellid  2010   THERAPEUTIC ABORTION     X 2   tubes in ears     as a child    Review of systems negative except as noted in HPI / PMHx or noted below:  Review of Systems  Constitutional: Negative.   HENT: Negative.    Eyes: Negative.   Respiratory: Negative.    Cardiovascular: Negative.   Gastrointestinal: Negative.   Genitourinary: Negative.    Musculoskeletal: Negative.   Skin: Negative.   Neurological: Negative.   Endo/Heme/Allergies: Negative.   Psychiatric/Behavioral: Negative.       Objective:   Vitals:   04/28/22 1218  BP: 130/82  Pulse: 87  Resp: 16  Temp: 97.8 F (36.6 C)  SpO2: 96%   Height: '5\' 3"'$  (160 cm)      Physical Exam Constitutional:      Appearance: She is not diaphoretic.  HENT:     Head: Normocephalic.     Right Ear: Tympanic membrane, ear canal and external ear normal.     Left Ear: Tympanic membrane, ear canal and external ear normal.     Nose: Nose normal. No mucosal edema or rhinorrhea.     Mouth/Throat:     Pharynx: Uvula midline. No oropharyngeal exudate.  Eyes:     Conjunctiva/sclera: Conjunctivae normal.  Neck:     Thyroid: No thyromegaly.     Trachea: Trachea normal. No tracheal tenderness or tracheal deviation.  Cardiovascular:     Rate and Rhythm: Normal rate and regular rhythm.     Heart sounds: Normal heart sounds, S1 normal and S2 normal. No murmur heard. Pulmonary:     Effort: No respiratory distress.     Breath sounds: Normal breath sounds. No stridor. No wheezing or rales.  Lymphadenopathy:     Head:     Right side of head: No tonsillar adenopathy.     Left side of head: No tonsillar adenopathy.     Cervical: No cervical adenopathy.  Skin:    Findings: Rash (Erythematous patch right neck) present. No erythema.     Nails: There is no clubbing.  Neurological:     Mental Status: She is alert.     Diagnostics:    Spirometry was performed and demonstrated an FEV1 of 2.64 at 102 % of predicted.  Assessment and Plan:   1. Asthma, mild intermittent, well-controlled   2. Perennial allergic rhinitis   3. LPRD (laryngopharyngeal reflux disease)   4. Contact dermatitis due to chemicals    1.  Continue to treat and prevent reflux / LPR:   A.  Pantoprazole 40 mg -2 tablets daily  2.  If needed:   A. Albuterol HFA - 2 inhalations every 4-6 hours  B. OTC  antihistamine  C. Nasonex - 1 spray each nostril 1- 7 times per week  4.  Sample Eucrisa - apply 2 times a day. Do not rub the inflamed area  5. Return to clinic in 12 months or earlier if needed  6.  Obtain repeat colonoscopy  Catherine Rodriguez appears to be doing quite well with her airway issue and her reflux issue while utilizing the plan noted above.  She has  a contact dermatitis that developed over the course of the past several days after being exposed to cologne and I have given her some samples of Eucrisa to address that issue.  She has a history of having adenomatous polyps in her colon and she is late for her repeat colonoscopy and I have encouraged her to get that arranged sometime in 2024.  If she does well with the plan noted above I will see her back in this clinic in 12 months or earlier if there is a problem.  Allena Katz, MD Allergy / Immunology [place]

## 2022-04-29 ENCOUNTER — Encounter: Payer: Self-pay | Admitting: Allergy and Immunology

## 2022-05-06 ENCOUNTER — Telehealth: Payer: Self-pay

## 2022-05-06 NOTE — Telephone Encounter (Signed)
Received PA request for Mometasone Furoate (Nasonex) 50 mcg/act from Covermymeds.  KEY: [id number]  Forwarding message to PA Team to complete PA.

## 2022-05-08 ENCOUNTER — Telehealth: Payer: Self-pay

## 2022-05-08 ENCOUNTER — Other Ambulatory Visit (HOSPITAL_COMMUNITY): Payer: Self-pay

## 2022-05-08 NOTE — Telephone Encounter (Signed)
PA request received from provider for Nasonex 50MCG/ACT suspension  PA has been submitted via CMM to [facility] and is pending determination.   Key: [id number]

## 2022-05-08 NOTE — Telephone Encounter (Signed)
Patient has other primary insurance, new request submitted to [facility], is pending determination; will update in additional encounter created.

## 2022-05-13 ENCOUNTER — Other Ambulatory Visit: Payer: Self-pay | Admitting: *Deleted

## 2022-05-13 MED ORDER — BUDESONIDE 32 MCG/ACT NA SUSP: 1.0000 | Freq: Every day | NASAL | 5 refills | Status: AC

## 2022-05-13 NOTE — Telephone Encounter (Signed)
Pa has been DENIED, denial determination letter has been faxed to providers office per insurance.

## 2022-05-13 NOTE — Telephone Encounter (Signed)
Sent in rhinocort to pts [facility]

## 2022-05-13 NOTE — Telephone Encounter (Signed)
Called and informed patient of change in nasal spray. Patient verbalized understanding.

## 2022-05-13 NOTE — Telephone Encounter (Signed)
Pa for nasonex denied please advise to change of therapy

## 2022-05-13 NOTE — Addendum Note (Signed)
Addended by: Felipa Emory on: 05/13/2022 04:55 PM   Modules accepted: Orders

## 2022-05-13 NOTE — Telephone Encounter (Signed)
Rhinocort looks to be covered and qnasl per insurance formulary

## 2022-09-07 DIAGNOSIS — M7581 Other shoulder lesions, right shoulder: Secondary | ICD-10-CM | POA: Insufficient documentation

## 2022-12-02 ENCOUNTER — Encounter: Payer: Self-pay | Admitting: Nurse Practitioner

## 2023-01-11 ENCOUNTER — Ambulatory Visit (INDEPENDENT_AMBULATORY_CARE_PROVIDER_SITE_OTHER): Payer: Medicaid Other | Admitting: Family Medicine

## 2023-01-11 ENCOUNTER — Encounter: Payer: Self-pay | Admitting: Family Medicine

## 2023-01-11 VITALS — BP 122/68 | HR 88 | Temp 97.6°F | Ht 63.0 in | Wt 213.6 lb

## 2023-01-11 DIAGNOSIS — T148XXA Other injury of unspecified body region, initial encounter: Secondary | ICD-10-CM

## 2023-01-11 DIAGNOSIS — R233 Spontaneous ecchymoses: Secondary | ICD-10-CM

## 2023-01-11 NOTE — Progress Notes (Signed)
Subjective:    Patient ID: Catherine Rodriguez, female    DOB: 04/04/1970, 52 y.o.   MRN: [id number]  HPI Patient is a very pleasant 52 year old Caucasian female who presents today due to easy bruising.  She states that recently she has noticed significant bruising without cause.  For instance she shows me a large bruise on her posterior lateral left thigh.  This is roughly the size of my hand.  She has no recollection of how she got this bruise.  She also has smaller bruises all over her legs.  Most of these are the diameter of a silver dollar.  She has a similar sized bruise on her posterior left shoulder.  She denies any bruises on her torso or abdomen's.  She denies any epistaxis or bleeding gums.  She denies any vaginal bleeding.  She denies any melena or hematochezia.  She denies any fevers.  She denies any chills.  She denies any significant night sweats or weight loss.  She has been seeing orthopedics due to bone pain in her joints however x-rays are reportedly normal. Past Medical History:  Diagnosis Date   ASCUS with positive high risk HPV cervical 2013   normal colposcopy   Asthma    GERD (gastroesophageal reflux disease)    Herpes simplex    IBS (irritable bowel syndrome)    Past Surgical History:  Procedure Laterality Date   APPENDECTOMY  1993   eyellid  2010   THERAPEUTIC ABORTION     X 2   tubes in ears     as a child   Current Outpatient Medications on File Prior to Visit  Medication Sig Dispense Refill   albuterol (VENTOLIN HFA) 108 (90 Base) MCG/ACT inhaler Inhale 2 puffs into the lungs every 4 (four) hours as needed for wheezing or shortness of breath. 18 g 2   ALPRAZolam (XANAX) 1 MG tablet as directed.  0   budesonide (RHINOCORT AQUA) 32 MCG/ACT nasal spray Place 1 spray into both nostrils daily. 8.43 mL 5   chlorpheniramine-HYDROcodone (TUSSIONEX PENNKINETIC ER) 10-8 MG/5ML SUER Take 5 mLs by mouth every 12 (twelve) hours as needed for cough. 140 mL 0   D3-50 50000  units capsule Take 1 tablet by mouth once a week.  5   eszopiclone (LUNESTA) 1 MG TABS tablet Take 1 mg by mouth at bedtime as needed for sleep. Take immediately before bedtime     Eszopiclone 3 MG TABS Take 3 mg by mouth at bedtime.     famotidine (PEPCID) 40 MG tablet Take 1 tablet in the evening 30 tablet 5   Fluticasone-Umeclidin-Vilant (TRELEGY ELLIPTA) 200-62.5-25 MCG/INH AEPB Inhale 1 puff into the lungs daily. 60 each 5   ibuprofen (ADVIL) 800 MG tablet TAKE 1 TABLET BY MOUTH EVERY 8 HOURS AS NEEDED 90 tablet 0   meloxicam (MOBIC) 15 MG tablet Take 15 mg by mouth every other day.     mometasone (NASONEX) 50 MCG/ACT nasal spray 1 spray each nostril 2 times per day 17 g 5   pantoprazole (PROTONIX) 40 MG tablet Take 1 tablet (40 mg total) by mouth 2 (two) times daily. 180 tablet 3   promethazine (PHENERGAN) 25 MG tablet TAKE 1 TABLET BY MOUTH EVERY 8 HOURS AS NEEDED FOR NAUSEA FOR VOMITING 20 tablet 0   Semaglutide-Weight Management (WEGOVY) 0.25 MG/0.5ML SOAJ Inject as directed once weekly. 2 mL 0   sertraline (ZOLOFT) 100 MG tablet Take 100 mg by mouth daily.     traMADol (  ULTRAM) 50 MG tablet Take 50 mg by mouth 2 (two) times daily.     valACYclovir (VALTREX) 500 MG tablet Take 1 tablet (500 mg total) by mouth 2 (two) times daily. At onset of outbreak for 5 days 30 tablet 6   No current facility-administered medications on file prior to visit.   Allergies  Allergen Reactions   Doxycycline Nausea And Vomiting   Macrobid [Nitrofurantoin Macrocrystal] Nausea And Vomiting   Penicillins     Got real sick   Social History   Socioeconomic History   Marital status: Single    Spouse name: Not on file   Number of children: Not on file   Years of education: Not on file   Highest education level: Not on file  Occupational History   Not on file  Tobacco Use   Smoking status: Never   Smokeless tobacco: Never  Vaping Use   Vaping status: Never Used  Substance and Sexual Activity    Alcohol use: Yes    Alcohol/week: 0.0 standard drinks of alcohol    Comment: Occas wine   Drug use: No   Sexual activity: Yes    Comment: 1st intercourse 52 yo-Fewer than 5 partners  Other Topics Concern   Not on file  Social History Narrative   Not on file   Social Determinants of Health   Financial Resource Strain: Not on file  Food Insecurity: Not on file  Transportation Needs: Not on file  Physical Activity: Not on file  Stress: Not on file  Social Connections: Not on file  Intimate Partner Violence: Not on file     Review of Systems  All other systems reviewed and are negative.      Objective:   Physical Exam Vitals reviewed.  Constitutional:      Appearance: Normal appearance.  Cardiovascular:     Rate and Rhythm: Normal rate and regular rhythm.     Heart sounds: Normal heart sounds.  Pulmonary:     Effort: Pulmonary effort is normal. No respiratory distress.     Breath sounds: Decreased breath sounds present. No wheezing, rhonchi or rales.    Abdominal:     General: Abdomen is flat. Bowel sounds are normal. There is no distension.     Palpations: Abdomen is soft.     Tenderness: There is no abdominal tenderness.  Skin:    Findings: Bruising present.  Neurological:     Mental Status: She is alert.           Assessment & Plan:  Bruising - Plan: CBC with Differential/Platelet, COMPLETE METABOLIC PANEL WITH GFR, PT with INR/Fingerstick, Ptt factor inhibitor (mixing study), Sedimentation rate, Pathologist smear review  Patient has significant easy bruising for unknown reason.  Start by checking a CBC to evaluate her white blood cell count for any evidence of leukemia.  Also rule out thrombocytopenia with a CBC.  CMP to check for liver or kidney dysfunction.  Check a PTT/INR/PTT for any evidence of clotting factor deficiency.  Check a pathology smear for any evidence of bone marrow abnormalities.  Check a sedimentation rate to evaluate for any evidence of  autoimmune disease.  Await the results of blood work before determining the next step in the workup.  Her review of systems is also significant for right flank pain that she was told were due to gallstones/biliary sludge.  She also reports orthostatic dizziness however blood pressure today is normal.

## 2023-01-11 NOTE — Addendum Note (Signed)
Addended by: Venia Carbon K on: 01/11/2023 03:27 PM   Modules accepted: Orders

## 2023-01-11 NOTE — Addendum Note (Signed)
Addended by: Venia Carbon K on: 01/11/2023 03:30 PM   Modules accepted: Orders

## 2023-01-12 LAB — CBC WITH DIFFERENTIAL/PLATELET
Absolute Lymphocytes: 1095 {cells}/uL (ref 850–3900)
Absolute Monocytes: 490 {cells}/uL (ref 200–950)
Basophils Absolute: 41 {cells}/uL (ref 0–200)
Basophils Relative: 0.6 %
Eosinophils Absolute: 82 {cells}/uL (ref 15–500)
Eosinophils Relative: 1.2 %
HCT: 45.2 % — ABNORMAL HIGH (ref 35.0–45.0)
Hemoglobin: 14.6 g/dL (ref 11.7–15.5)
MCH: 30.3 pg (ref 27.0–33.0)
MCHC: 32.3 g/dL (ref 32.0–36.0)
MCV: 93.8 fL (ref 80.0–100.0)
MPV: 11.3 fL (ref 7.5–12.5)
Monocytes Relative: 7.2 %
Neutro Abs: 5093 {cells}/uL (ref 1500–7800)
Neutrophils Relative %: 74.9 %
Platelets: 255 10*3/uL (ref 140–400)
RBC: 4.82 10*6/uL (ref 3.80–5.10)
RDW: 12.2 % (ref 11.0–15.0)
Total Lymphocyte: 16.1 %
WBC: 6.8 10*3/uL (ref 3.8–10.8)

## 2023-01-12 LAB — COMPLETE METABOLIC PANEL WITH GFR
AG Ratio: 1.6 (calc) (ref 1.0–2.5)
ALT: 12 U/L (ref 6–29)
AST: 18 U/L (ref 10–35)
Albumin: 4.6 g/dL (ref 3.6–5.1)
Alkaline phosphatase (APISO): 90 U/L (ref 37–153)
BUN: 18 mg/dL (ref 7–25)
CO2: 28 mmol/L (ref 20–32)
Calcium: 10.1 mg/dL (ref 8.6–10.4)
Chloride: 103 mmol/L (ref 98–110)
Creat: 0.84 mg/dL (ref 0.50–1.03)
Globulin: 2.8 g/dL (ref 1.9–3.7)
Glucose, Bld: 68 mg/dL (ref 65–99)
Potassium: 5.4 mmol/L — ABNORMAL HIGH (ref 3.5–5.3)
Sodium: 140 mmol/L (ref 135–146)
Total Bilirubin: 0.4 mg/dL (ref 0.2–1.2)
Total Protein: 7.4 g/dL (ref 6.1–8.1)
eGFR: 83 mL/min/{1.73_m2} (ref >=60)

## 2023-01-12 LAB — PROTIME-INR
INR: 1
Prothrombin Time: 10.8 s (ref 9.0–11.5)

## 2023-01-12 LAB — PATHOLOGIST SMEAR REVIEW

## 2023-01-12 LAB — SEDIMENTATION RATE: Sed Rate: 14 mm/h (ref 0–30)

## 2023-01-25 ENCOUNTER — Telehealth: Payer: Self-pay | Admitting: Podiatry

## 2023-01-25 NOTE — Telephone Encounter (Signed)
Pt left message on 11/1 at 302pm stating her father is a pt of Dr Gabriel Rung and she is needing to be seen for some foot issues.   I returned call and voicemail is full so I could not leave a message for pt to call back.

## 2023-01-29 ENCOUNTER — Ambulatory Visit (INDEPENDENT_AMBULATORY_CARE_PROVIDER_SITE_OTHER): Payer: Medicaid Other | Admitting: Family Medicine

## 2023-01-29 VITALS — BP 110/62 | HR 65 | Temp 97.5°F | Ht 63.0 in | Wt 210.0 lb

## 2023-01-29 DIAGNOSIS — R1031 Right lower quadrant pain: Secondary | ICD-10-CM | POA: Diagnosis not present

## 2023-01-29 DIAGNOSIS — R109 Unspecified abdominal pain: Secondary | ICD-10-CM | POA: Diagnosis not present

## 2023-01-29 LAB — URINALYSIS, ROUTINE W REFLEX MICROSCOPIC
Bacteria, UA: NONE SEEN /HPF
Bilirubin Urine: NEGATIVE
Glucose, UA: NEGATIVE
Hgb urine dipstick: NEGATIVE
Hyaline Cast: NONE SEEN /LPF
Ketones, ur: NEGATIVE
Leukocytes,Ua: NEGATIVE
Nitrite: NEGATIVE
RBC / HPF: NONE SEEN /HPF (ref 0–2)
Specific Gravity, Urine: 1.025 (ref 1.001–1.035)
WBC, UA: NONE SEEN /HPF (ref 0–5)
pH: 6 (ref 5.0–8.0)

## 2023-01-29 LAB — MICROSCOPIC MESSAGE

## 2023-01-29 MED ORDER — PROMETHAZINE HCL 25 MG PO TABS: ORAL_TABLET | ORAL | 0 refills | Status: AC

## 2023-01-29 NOTE — Addendum Note (Signed)
Addended by: Lynnea Ferrier T on: 01/29/2023 01:31 PM   Modules accepted: Orders

## 2023-01-29 NOTE — Progress Notes (Signed)
Subjective:    Patient ID: Catherine Rodriguez, female    DOB: 08-Nov-1970, 52 y.o.   MRN: [id number]  HPI Patient has a history of biliary sludge seen on ultrasound 3 years ago.  However she reports over a month of pain in her right abdomen.  The pain is located more in her lower right flank and in her right mid axillary line.  She states the pain is constant.  It is not made worse by food.  There are no exacerbating factors.  There are no alleviating factors.  The pain does not radiate.  It stays in the right lower abdomen and in the right flank.  She denies any fevers or chills.  She denies any nausea or vomiting.  However she states the pain is intensifying.  Recently I saw the patient for excessive bruising laboratory workup was unremarkable.  Urinalysis today shows no nitrates, no leukocyte esterase, no blood.  Patient has no CVA tenderness on exam.  She has some minimal tenderness to palpation in the right upper quadrant however the majority of the patient's pain is in the right flank Past Medical History:  Diagnosis Date   ASCUS with positive high risk HPV cervical 2013   normal colposcopy   Asthma    GERD (gastroesophageal reflux disease)    Herpes simplex    IBS (irritable bowel syndrome)    Past Surgical History:  Procedure Laterality Date   APPENDECTOMY  1993   eyellid  2010   THERAPEUTIC ABORTION     X 2   tubes in ears     as a child   Current Outpatient Medications on File Prior to Visit  Medication Sig Dispense Refill   albuterol (VENTOLIN HFA) 108 (90 Base) MCG/ACT inhaler Inhale 2 puffs into the lungs every 4 (four) hours as needed for wheezing or shortness of breath. 18 g 2   ALPRAZolam (XANAX) 1 MG tablet as directed.  0   budesonide (RHINOCORT AQUA) 32 MCG/ACT nasal spray Place 1 spray into both nostrils daily. 8.43 mL 5   chlorpheniramine-HYDROcodone (TUSSIONEX PENNKINETIC ER) 10-8 MG/5ML SUER Take 5 mLs by mouth every 12 (twelve) hours as needed for cough. 140 mL 0    D3-50 50000 units capsule Take 1 tablet by mouth once a week.  5   eszopiclone (LUNESTA) 1 MG TABS tablet Take 1 mg by mouth at bedtime as needed for sleep. Take immediately before bedtime     Eszopiclone 3 MG TABS Take 3 mg by mouth at bedtime.     famotidine (PEPCID) 40 MG tablet Take 1 tablet in the evening 30 tablet 5   Fluticasone-Umeclidin-Vilant (TRELEGY ELLIPTA) 200-62.5-25 MCG/INH AEPB Inhale 1 puff into the lungs daily. 60 each 5   ibuprofen (ADVIL) 800 MG tablet TAKE 1 TABLET BY MOUTH EVERY 8 HOURS AS NEEDED 90 tablet 0   meloxicam (MOBIC) 15 MG tablet Take 15 mg by mouth every other day.     mometasone (NASONEX) 50 MCG/ACT nasal spray 1 spray each nostril 2 times per day 17 g 5   pantoprazole (PROTONIX) 40 MG tablet Take 1 tablet (40 mg total) by mouth 2 (two) times daily. 180 tablet 3   promethazine (PHENERGAN) 25 MG tablet TAKE 1 TABLET BY MOUTH EVERY 8 HOURS AS NEEDED FOR NAUSEA FOR VOMITING 20 tablet 0   Semaglutide-Weight Management (WEGOVY) 0.25 MG/0.5ML SOAJ Inject as directed once weekly. 2 mL 0   sertraline (ZOLOFT) 100 MG tablet Take 100 mg by mouth daily.  traMADol (ULTRAM) 50 MG tablet Take 50 mg by mouth 2 (two) times daily.     valACYclovir (VALTREX) 500 MG tablet Take 1 tablet (500 mg total) by mouth 2 (two) times daily. At onset of outbreak for 5 days 30 tablet 6   No current facility-administered medications on file prior to visit.   Allergies  Allergen Reactions   Doxycycline Nausea And Vomiting   Macrobid [Nitrofurantoin Macrocrystal] Nausea And Vomiting   Penicillins     Got real sick   Social History   Socioeconomic History   Marital status: Single    Spouse name: Not on file   Number of children: Not on file   Years of education: Not on file   Highest education level: Not on file  Occupational History   Not on file  Tobacco Use   Smoking status: Never   Smokeless tobacco: Never  Vaping Use   Vaping status: Never Used  Substance and Sexual  Activity   Alcohol use: Yes    Alcohol/week: 0.0 standard drinks of alcohol    Comment: Occas wine   Drug use: No   Sexual activity: Yes    Comment: 1st intercourse 52 yo-Fewer than 5 partners  Other Topics Concern   Not on file  Social History Narrative   Not on file   Social Determinants of Health   Financial Resource Strain: Not on file  Food Insecurity: Not on file  Transportation Needs: Not on file  Physical Activity: Not on file  Stress: Not on file  Social Connections: Not on file  Intimate Partner Violence: Not on file     Review of Systems  All other systems reviewed and are negative.      Objective:   Physical Exam Vitals reviewed.  Constitutional:      Appearance: Normal appearance.  Cardiovascular:     Rate and Rhythm: Normal rate and regular rhythm.     Heart sounds: Normal heart sounds.  Pulmonary:     Effort: Pulmonary effort is normal. No respiratory distress.     Breath sounds: No decreased breath sounds, wheezing, rhonchi or rales.    Abdominal:     General: Abdomen is flat. Bowel sounds are normal. There is no distension.     Palpations: Abdomen is soft.     Tenderness: There is no abdominal tenderness.    Skin:    Findings: Bruising present.  Neurological:     Mental Status: She is alert.           Assessment & Plan:  Flank pain - Plan: Urinalysis, Routine w reflex microscopic  Right lower quadrant abdominal pain - Plan: CT ABDOMEN PELVIS W CONTRAST Patient has a history of biliary sludge.  This could certainly explain right upper quadrant abdominal pain.  However the majority of the patient's pain today is in her right flank although she is slightly tender to palpation in the right upper quadrant.  Therefore I recommended that we repeat a CT scan of the abdomen and pelvis to rule out any other potential causes of right-sided abdominal pain prior to a referral to a surgeon for cholecystectomy.  Her symptoms are not classic enough to  warrant no additional workup prior to surgery.

## 2023-02-01 ENCOUNTER — Ambulatory Visit: Payer: BC Managed Care – PPO | Admitting: Nurse Practitioner

## 2023-02-08 ENCOUNTER — Other Ambulatory Visit (HOSPITAL_COMMUNITY)
Admission: RE | Admit: 2023-02-08 | Discharge: 2023-02-08 | Disposition: A | Discharge status: Home / Self Care | Payer: Medicaid Other | Source: Ambulatory Visit | Attending: Nurse Practitioner | Admitting: Nurse Practitioner

## 2023-02-08 ENCOUNTER — Encounter: Payer: Self-pay | Admitting: Nurse Practitioner

## 2023-02-08 ENCOUNTER — Ambulatory Visit (INDEPENDENT_AMBULATORY_CARE_PROVIDER_SITE_OTHER): Payer: Medicaid Other | Admitting: Nurse Practitioner

## 2023-02-08 VITALS — BP 110/82 | HR 82 | Ht 63.75 in | Wt 209.0 lb

## 2023-02-08 DIAGNOSIS — Z23 Encounter for immunization: Secondary | ICD-10-CM | POA: Diagnosis not present

## 2023-02-08 DIAGNOSIS — B009 Herpesviral infection, unspecified: Secondary | ICD-10-CM

## 2023-02-08 DIAGNOSIS — Z780 Asymptomatic menopausal state: Secondary | ICD-10-CM | POA: Diagnosis not present

## 2023-02-08 DIAGNOSIS — Z01419 Encounter for gynecological examination (general) (routine) without abnormal findings: Secondary | ICD-10-CM | POA: Diagnosis not present

## 2023-02-08 DIAGNOSIS — Z124 Encounter for screening for malignant neoplasm of cervix: Secondary | ICD-10-CM | POA: Diagnosis present

## 2023-02-08 MED ORDER — VALACYCLOVIR HCL 500 MG PO TABS: 500.0000 mg | ORAL_TABLET | Freq: Two times a day (BID) | ORAL | 1 refills | Status: DC

## 2023-02-08 NOTE — Progress Notes (Signed)
Catherine Rodriguez May 02, 1970 [id number]   History:  52 y.o. G2P0020 presents for annual exam. Postmenopausal - no HRT, no bleeding. 2013 ASCUS positive HPV, subsequent paps normal. HSV, rare outbreaks. History of RA, IBS and asthma. Has abdominal CT scheduled 11/22 by PCP for right sided abdominal pain. Would like Flu shot today.   Gynecologic History No LMP recorded. Postmenopausal Contraception/Family planning: postmenopausal status Sexually active: Yes  Health Maintenance Last Pap: 01/19/2018. Results were: Normal neg HPV Last mammogram: 11/27/2022. Results were: Normal Last colonoscopy: 2013. Results were: tubular adenoma, 5-year repeat Last Dexa: Not indicated  Past medical history, past surgical history, family history and social history were all reviewed and documented in the EPIC chart. Single. Retired. 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 Sister diagnosed with breast cancer at age 54.  ROS:  A ROS was performed and pertinent positives and negatives are included.  Exam:  Vitals:   02/08/23 1330  BP: 110/82  Pulse: 82  SpO2: 99%  Weight: 209 lb (94.8 kg)  Height: 5' 3.75" (1.619 m)      Body mass index is 36.16 kg/m.  General appearance:  Normal Thyroid:  Symmetrical, normal in size, without palpable masses or nodularity. Respiratory  Auscultation:  Clear without wheezing or rhonchi Cardiovascular  Auscultation:  Regular rate, without rubs, murmurs or gallops  Edema/varicosities:  Not grossly evident Abdominal  Soft,nontender, without masses, guarding or rebound.  Liver/spleen:  No organomegaly noted  Hernia:  None appreciated  Skin  Inspection:  Grossly normal   Breasts: Examined lying and sitting.   Right: Without masses, retractions, discharge or axillary adenopathy.   Left: Without masses, retractions, discharge or axillary adenopathy. Pelvic: External genitalia:  no lesions              Urethra:  normal appearing  urethra with no masses, tenderness or lesions              Bartholins and Skenes: normal                 Vagina: normal appearing vagina with normal color and discharge, no lesions              Cervix: no lesions Bimanual Exam:  Uterus:  no masses or tenderness              Adnexa: no mass, fullness, tenderness              Rectovaginal: Deferred              Anus:  normal, no lesions  Patient informed chaperone available to be present for breast and pelvic exam. Patient has requested no chaperone to be present. Patient has been advised what will be completed during breast and pelvic exam.   Assessment/Plan:  52 y.o. G2P0020 for annual exam.   Well female exam with routine gynecological exam - Education provided on SBEs, importance of preventative screenings, current guidelines, high calcium diet, regular exercise, and multivitamin daily. Labs with PCP.  Postmenopausal - no HRT, no bleeding  Cervical cancer screening - Plan: Cytology - PAP( [place]). 2013 ASCUS with positive HPV, prior and subsequent paps normal.    HSV (herpes simplex virus) infection - Plan: valACYclovir (VALTREX) 500 MG tablet BID x 3-5 days at first sign of outbreak. Rare outbreaks.  Encounter for immunization - Plan: Flu vaccine trivalent PF, 6mos and older(Flulaval,Afluria,Fluarix,Fluzone)  Screening for breast cancer - Normal mammogram history.  Sister diagnosed with breast cancer at  age 54.  Continue annual screenings.  Normal breast exam today.  Screening for colon cancer - Overdue for screening colonoscopy per GI recommendations and encouraged to schedule.  Screening for osteoporosis - Average risk. Will plan for DXA at age 52.   Return in about 1 year (around 02/08/2024) for Annual.     Olivia Mackie [facility], 1:59 PM 02/08/2023

## 2023-02-10 LAB — CYTOLOGY - PAP
Adequacy: ABSENT
Comment: NEGATIVE
Diagnosis: NEGATIVE
High risk HPV: NEGATIVE

## 2023-02-11 ENCOUNTER — Encounter: Payer: Self-pay | Admitting: Podiatry

## 2023-02-11 ENCOUNTER — Ambulatory Visit: Payer: Medicaid Other | Admitting: Podiatry

## 2023-02-11 ENCOUNTER — Ambulatory Visit (INDEPENDENT_AMBULATORY_CARE_PROVIDER_SITE_OTHER): Payer: Medicaid Other

## 2023-02-11 DIAGNOSIS — M7751 Other enthesopathy of right foot: Secondary | ICD-10-CM | POA: Diagnosis not present

## 2023-02-11 DIAGNOSIS — M778 Other enthesopathies, not elsewhere classified: Secondary | ICD-10-CM

## 2023-02-11 DIAGNOSIS — Q666 Other congenital valgus deformities of feet: Secondary | ICD-10-CM

## 2023-02-11 NOTE — Progress Notes (Signed)
Subjective:   Patient ID: Catherine Rodriguez, female   DOB: 52 y.o.   MRN: [id number]   HPI Chief Complaint  Patient presents with   Foot Pain    RM#11 right foot pain all the time saw ortho no relief from them.   52 year old female presents the office today with above concerns.  She states that she has been having pain to her right foot for the last 3 to 4 years and occasionally swell.  She.  Spine injection was provided short-term relief.  She has been on meloxicam for other issues as well.  She wants to see there is any other options besides surgery.   Review of Systems  All other systems reviewed and are negative.  Past Medical History:  Diagnosis Date   ASCUS with positive high risk HPV cervical 2013   normal colposcopy   Asthma    GERD (gastroesophageal reflux disease)    Herpes simplex    IBS (irritable bowel syndrome)     Past Surgical History:  Procedure Laterality Date   APPENDECTOMY  1993   eyellid  2010   THERAPEUTIC ABORTION     X 2   tubes in ears     as a child     Current Outpatient Medications:    albuterol (VENTOLIN HFA) 108 (90 Base) MCG/ACT inhaler, Inhale 2 puffs into the lungs every 4 (four) hours as needed for wheezing or shortness of breath., Disp: 18 g, Rfl: 2   ALPRAZolam (XANAX) 1 MG tablet, as directed., Disp: , Rfl: 0   budesonide (RHINOCORT AQUA) 32 MCG/ACT nasal spray, Place 1 spray into both nostrils daily., Disp: 8.43 mL, Rfl: 5   chlorpheniramine-HYDROcodone (TUSSIONEX PENNKINETIC ER) 10-8 MG/5ML SUER, Take 5 mLs by mouth every 12 (twelve) hours as needed for cough., Disp: 140 mL, Rfl: 0   D3-50 50000 units capsule, Take 1 tablet by mouth once a week., Disp: , Rfl: 5   eszopiclone (LUNESTA) 1 MG TABS tablet, Take 1 mg by mouth at bedtime as needed for sleep. Take immediately before bedtime, Disp: , Rfl:    Eszopiclone 3 MG TABS, Take 3 mg by mouth at bedtime., Disp: , Rfl:    famotidine (PEPCID) 40 MG tablet, Take 1 tablet in the evening,  Disp: 30 tablet, Rfl: 5   Fluticasone-Umeclidin-Vilant (TRELEGY ELLIPTA) 200-62.5-25 MCG/INH AEPB, Inhale 1 puff into the lungs daily., Disp: 60 each, Rfl: 5   ibuprofen (ADVIL) 800 MG tablet, TAKE 1 TABLET BY MOUTH EVERY 8 HOURS AS NEEDED, Disp: 90 tablet, Rfl: 0   meloxicam (MOBIC) 15 MG tablet, Take 15 mg by mouth every other day., Disp: , Rfl:    mometasone (NASONEX) 50 MCG/ACT nasal spray, 1 spray each nostril 2 times per day, Disp: 17 g, Rfl: 5   pantoprazole (PROTONIX) 40 MG tablet, Take 1 tablet (40 mg total) by mouth 2 (two) times daily., Disp: 180 tablet, Rfl: 3   promethazine (PHENERGAN) 25 MG tablet, TAKE 1 TABLET BY MOUTH EVERY 8 HOURS AS NEEDED FOR NAUSEA FOR VOMITING, Disp: 20 tablet, Rfl: 0   Semaglutide-Weight Management (WEGOVY) 0.25 MG/0.5ML SOAJ, Inject as directed once weekly., Disp: 2 mL, Rfl: 0   sertraline (ZOLOFT) 100 MG tablet, Take 100 mg by mouth daily., Disp: , Rfl:    traMADol (ULTRAM) 50 MG tablet, Take 50 mg by mouth 2 (two) times daily., Disp: , Rfl:    valACYclovir (VALTREX) 500 MG tablet, Take 1 tablet (500 mg total) by mouth 2 (two) times  daily. At onset of outbreak for 5 days, Disp: 30 tablet, Rfl: 1  Allergies  Allergen Reactions   Doxycycline Nausea And Vomiting   Macrobid [Nitrofurantoin Macrocrystal] Nausea And Vomiting   Penicillins     Got real sick           Objective:  Physical Exam  General: AAO x3, NAD  Dermatological: Skin is warm, dry and supple bilateral.  There are no open sores, no preulcerative lesions, no rash or signs of infection present.  Vascular: Dorsalis Pedis artery and Posterior Tibial artery pedal pulses are 2/4 bilateral with immedate capillary fill time. There is no pain with calf compression, swelling, warmth, erythema.   Neruologic: Grossly intact via light touch bilateral.   Musculoskeletal: She gets tenderness palpation of the dorsal aspect of the right foot on the midfoot on the Lisfranc joint.  There is palpable  spurring present.  There is trace edema.  No erythema or warmth.  Flexor, extensor tendons appear to be intact.  She does tend to pronate more on the left than the right     Assessment:   52 year old female with midfoot arthritis     Plan:  -Treatment options discussed including all alternatives, risks, and complications -Etiology of symptoms were discussed -X-rays were obtained and reviewed with the patient.  3 views of the right foot were obtained.  There is no evidence of acute fracture.  Arthritic changes present on the Lisfranc joint. -We discussed both conservative as well as surgical treatment options.  She wants to try to hold off on surgery if possible.  Discussed its difficult to manage midfoot arthritis we discussed shoes with better support.  Dispensed a graphite insert.  Consider custom orthotics.  Will hold off another injection today as it was not overly beneficial last time.  She is already on anti-inflammatories which she can continue.  Return in about 6 weeks (around 03/25/2023).  Vivi Barrack DPM

## 2023-02-12 ENCOUNTER — Ambulatory Visit
Admission: RE | Admit: 2023-02-12 | Discharge: 2023-02-12 | Disposition: A | Discharge status: Home / Self Care | Payer: Medicaid Other | Source: Ambulatory Visit | Attending: Family Medicine | Admitting: Family Medicine

## 2023-02-12 DIAGNOSIS — R1031 Right lower quadrant pain: Secondary | ICD-10-CM

## 2023-02-12 MED ORDER — IOPAMIDOL (ISOVUE-300) INJECTION 61%
500.0000 mL | Freq: Once | INTRAVENOUS | Status: AC | PRN
Start: 2023-02-12 — End: 2023-02-12
  Administered 2023-02-12: 100 mL via INTRAVENOUS

## 2023-02-16 ENCOUNTER — Telehealth: Payer: Self-pay

## 2023-02-16 NOTE — Telephone Encounter (Signed)
Copied from CRM [id number]. Topic: Clinical - Lab/Test Results >> Feb 16, 2023 11:46 AM Gaetano Hawthorne wrote: Reason for CRM: Patient is calling for two reasons - they would like to know the results of their most recent CT scan and there was a lab result that was sent out to [facility] which had not come back yet that she was waiting for results on. Call back # [phone]

## 2023-03-04 ENCOUNTER — Ambulatory Visit: Payer: Medicaid Other | Admitting: Podiatry

## 2023-03-19 ENCOUNTER — Other Ambulatory Visit: Payer: Self-pay | Admitting: Nurse Practitioner

## 2023-03-19 DIAGNOSIS — B009 Herpesviral infection, unspecified: Secondary | ICD-10-CM

## 2023-03-19 NOTE — Telephone Encounter (Signed)
Med refill request: Valtrex Last AEX: 02/08/23 w/ TW Next AEX: not scheduled Last MMG (if hormonal med) 11/27/22 Refill authorized: Please Advise, #30, 0 RF

## 2023-03-24 DIAGNOSIS — M7071 Other bursitis of hip, right hip: Secondary | ICD-10-CM | POA: Insufficient documentation

## 2023-03-25 ENCOUNTER — Encounter: Payer: Self-pay | Admitting: Podiatry

## 2023-03-25 ENCOUNTER — Telehealth: Payer: Self-pay

## 2023-03-25 ENCOUNTER — Ambulatory Visit: Payer: Medicaid Other | Admitting: Podiatry

## 2023-03-25 DIAGNOSIS — M19071 Primary osteoarthritis, right ankle and foot: Secondary | ICD-10-CM

## 2023-03-25 DIAGNOSIS — Q666 Other congenital valgus deformities of feet: Secondary | ICD-10-CM

## 2023-03-25 MED ORDER — DICLOFENAC EPOLAMINE 1.3 % EX PTCH: 1.0000 | MEDICATED_PATCH | Freq: Two times a day (BID) | CUTANEOUS | 0 refills | Status: AC

## 2023-03-25 NOTE — Telephone Encounter (Signed)
 PA request received from covermymeds for diclofenac epolamine 1.3% patches. PA submitted trough covermymeds and waiting on response.

## 2023-03-26 NOTE — Telephone Encounter (Signed)
 PA was denied. They may consider approval of diclofenac patches after trial and failure of diclofenac topical gel. You can request a peer to peer by calling [phone] PA request number [id number]

## 2023-03-28 NOTE — Progress Notes (Signed)
 Subjective: Chief Complaint  Patient presents with   Foot Pain    RM#13 Right foot pain not getting better still in pain nothing is helping.   53 year old female presents the office today with the above concerns.  She states that she is still having the same pain.  She did try the graphite insert.  She has also seen another provider and she had a steroid injection which gave her very short-term relief.  She still gets pain mostly with walking on a regular basis.  No new issues or concerns.  No injuries that she reports.   Objective: AAO x3, NAD DP/PT pulses palpable bilaterally, CRT less than 3 seconds Still tenderness palpation on dorsal aspect of midfoot on Lisfranc joint on the right foot.  Small palpable spurring noted.  There is a trace edema localized to the area there is no erythema.  Flexor, extensor tendons appear to be intact.  MMT 5/5. No pain with calf compression, swelling, warmth, erythema  Assessment: Capsulitis, arthritis right foot  Plan: -All treatment options discussed with the patient including all alternatives, risks, complications.  -Again discussed the conservative as well as surgical treatment options.  Given ongoing nature of her symptoms for the last month that significant improvement I will order a MRI to further evaluate.  X-rays are consistent with some arthritis in the Lisfranc joint.  She has previously had injections, shoe changes, insoles/graphite insert without significant improvement.  He has been treated for greater than 3 months without significant improvement between myself and not a provider. -I prescribed Flector  patches to apply topically. -Patient encouraged to call the office with any questions, concerns, change in symptoms.   Catherine Rodriguez DPM

## 2023-03-29 ENCOUNTER — Telehealth: Payer: Self-pay | Admitting: Family Medicine

## 2023-03-29 NOTE — Telephone Encounter (Signed)
 Copied from CRM [id number]. Topic: Referral - Question >> Mar 26, 2023 11:23 AM Jamee ORN wrote: Reason for CRM: Patient stated that she needed a new referral for further imaging for her abdominal pain. She said that the previous imaging showed that the issue is not with her gallbladder as previously suspected.  Patient wants a call back in regards to getting another referral for further imaging.

## 2023-04-04 ENCOUNTER — Other Ambulatory Visit: Payer: Self-pay | Admitting: Radiology

## 2023-04-04 DIAGNOSIS — B009 Herpesviral infection, unspecified: Secondary | ICD-10-CM

## 2023-04-05 ENCOUNTER — Encounter: Payer: Self-pay | Admitting: Family Medicine

## 2023-04-05 ENCOUNTER — Ambulatory Visit: Payer: Medicaid Other | Admitting: Family Medicine

## 2023-04-05 VITALS — BP 120/70 | HR 100 | Temp 97.6°F | Ht 63.75 in | Wt 207.0 lb

## 2023-04-05 DIAGNOSIS — R1031 Right lower quadrant pain: Secondary | ICD-10-CM

## 2023-04-05 MED ORDER — CYCLOBENZAPRINE HCL 10 MG PO TABS: 10.0000 mg | ORAL_TABLET | Freq: Three times a day (TID) | ORAL | 0 refills | Status: DC | PRN

## 2023-04-05 NOTE — Telephone Encounter (Signed)
 Med refill request: Valtrex Last AEX: 02/08/23 w/ TW Next AEX: not scheduled Last MMG (if hormonal med) 11/27/22 Refill authorized: Please Advise, #30, 0 RF

## 2023-04-05 NOTE — Progress Notes (Signed)
 Subjective:    Patient ID: Catherine Rodriguez, female    DOB: January 02, 1971, 53 y.o.   MRN: [id number]  HPI 01/29/23 Patient has a history of biliary sludge seen on ultrasound 3 years ago.  However she reports over a month of pain in her right abdomen.  The pain is located more in her lower right flank and in her right mid axillary line.  She states the pain is constant.  It is not made worse by food.  There are no exacerbating factors.  There are no alleviating factors.  The pain does not radiate.  It stays in the right lower abdomen and in the right flank.  She denies any fevers or chills.  She denies any nausea or vomiting.  However she states the pain is intensifying.  Recently I saw the patient for excessive bruising laboratory workup was unremarkable.  Urinalysis today shows no nitrates, no leukocyte esterase, no blood.  Patient has no CVA tenderness on exam.  She has some minimal tenderness to palpation in the right upper quadrant however the majority of the patient's pain is in the right flank. At that time, my plan was: Patient has a history of biliary sludge.  This could certainly explain right upper quadrant abdominal pain.  However the majority of the patient's pain today is in her right flank although she is slightly tender to palpation in the right upper quadrant.  Therefore I recommended that we repeat a CT scan of the abdomen and pelvis to rule out any other potential causes of right-sided abdominal pain prior to a referral to a surgeon for cholecystectomy.  Her symptoms are not classic enough to warrant no additional workup prior to surgery.  04/05/23 CT scan in 11/24 revealed no abnormalities and general surgery did not believe her pain was due to biliary colic.  Here today for follow up.  Pain is located primarily in the mid axillary line just above and slightly posterior to the anterior iliac spine patient is tender to palpation in that area and just below her ribs.  She has no pain in the  right upper quadrant today.  The pain is constant.  She states that it is worse at night when she tries to sleep.  Movement does not trigger the pain.  She denies any numbness and tingling to suggest thoracic radiculopathy.  Food does not trigger the pain.  She denies any nausea or vomiting.  She denies any melena or hematochezia.  There is no vaginal bleeding. Past Medical History:  Diagnosis Date   ASCUS with positive high risk HPV cervical 2013   normal colposcopy   Asthma    GERD (gastroesophageal reflux disease)    Herpes simplex    IBS (irritable bowel syndrome)    Past Surgical History:  Procedure Laterality Date   APPENDECTOMY  1993   eyellid  2010   THERAPEUTIC ABORTION     X 2   tubes in ears     as a child   Current Outpatient Medications on File Prior to Visit  Medication Sig Dispense Refill   albuterol  (VENTOLIN  HFA) 108 (90 Base) MCG/ACT inhaler Inhale 2 puffs into the lungs every 4 (four) hours as needed for wheezing or shortness of breath. 18 g 2   ALPRAZolam  (XANAX ) 1 MG tablet as directed.  0   budesonide  (RHINOCORT  AQUA) 32 MCG/ACT nasal spray Place 1 spray into both nostrils daily. 8.43 mL 5   chlorpheniramine-HYDROcodone  (TUSSIONEX PENNKINETIC ER) 10-8 MG/5ML SUER Take  5 mLs by mouth every 12 (twelve) hours as needed for cough. 140 mL 0   D3-50 50000 units capsule Take 1 tablet by mouth once a week.  5   diclofenac  (FLECTOR ) 1.3 % PTCH Place 1 patch onto the skin 2 (two) times daily. 60 patch 0   eszopiclone (LUNESTA) 1 MG TABS tablet Take 1 mg by mouth at bedtime as needed for sleep. Take immediately before bedtime     Eszopiclone 3 MG TABS Take 3 mg by mouth at bedtime.     famotidine  (PEPCID ) 40 MG tablet Take 1 tablet in the evening 30 tablet 5   Fluticasone-Umeclidin-Vilant (TRELEGY ELLIPTA ) 200-62.5-25 MCG/INH AEPB Inhale 1 puff into the lungs daily. 60 each 5   ibuprofen  (ADVIL ) 800 MG tablet TAKE 1 TABLET BY MOUTH EVERY 8 HOURS AS NEEDED 90 tablet 0    meloxicam (MOBIC) 15 MG tablet Take 15 mg by mouth every other day.     mometasone  (NASONEX ) 50 MCG/ACT nasal spray 1 spray each nostril 2 times per day 17 g 5   pantoprazole  (PROTONIX ) 40 MG tablet Take 1 tablet (40 mg total) by mouth 2 (two) times daily. 180 tablet 3   promethazine  (PHENERGAN ) 25 MG tablet TAKE 1 TABLET BY MOUTH EVERY 8 HOURS AS NEEDED FOR NAUSEA FOR VOMITING 20 tablet 0   Semaglutide -Weight Management (WEGOVY ) 0.25 MG/0.5ML SOAJ Inject as directed once weekly. 2 mL 0   sertraline (ZOLOFT) 100 MG tablet Take 100 mg by mouth daily.     traMADol  (ULTRAM ) 50 MG tablet Take 50 mg by mouth 2 (two) times daily.     valACYclovir  (VALTREX ) 500 MG tablet TAKE ONE TABLET BY MOUTH TWICE DAILY AT ONSET OF OUTBREAK FOR FIVE DAYS 30 tablet 2   No current facility-administered medications on file prior to visit.   Allergies  Allergen Reactions   Doxycycline  Nausea And Vomiting   Macrobid  [Nitrofurantoin  Macrocrystal] Nausea And Vomiting   Penicillins     Got real sick   Social History   Socioeconomic History   Marital status: Single    Spouse name: Not on file   Number of children: Not on file   Years of education: Not on file   Highest education level: Not on file  Occupational History   Not on file  Tobacco Use   Smoking status: Never   Smokeless tobacco: Never  Vaping Use   Vaping status: Never Used  Substance and Sexual Activity   Alcohol use: Yes    Alcohol/week: 0.0 standard drinks of alcohol    Comment: Occas wine   Drug use: No   Sexual activity: Not Currently    Birth control/protection: Post-menopausal    Comment: 1st intercourse 53 yo-Fewer than 5 partners  Other Topics Concern   Not on file  Social History Narrative   Not on file   Social Drivers of Health   Financial Resource Strain: Not on file  Food Insecurity: Not on file  Transportation Needs: Not on file  Physical Activity: Not on file  Stress: Not on file  Social Connections: Not on file   Intimate Partner Violence: Not on file     Review of Systems  All other systems reviewed and are negative.      Objective:   Physical Exam Vitals reviewed.  Constitutional:      Appearance: Normal appearance.  Cardiovascular:     Rate and Rhythm: Normal rate and regular rhythm.     Heart sounds: Normal heart sounds.  Pulmonary:     Effort: Pulmonary effort is normal. No respiratory distress.     Breath sounds: No decreased breath sounds, wheezing, rhonchi or rales.  Abdominal:     General: Abdomen is flat. Bowel sounds are normal. There is no distension.     Palpations: Abdomen is soft.     Tenderness: There is abdominal tenderness.    Neurological:     Mental Status: She is alert.           Assessment & Plan:  Right lower quadrant abdominal pain - Plan: Ambulatory referral to Gastroenterology Pain has been present for some time do not feel any life-threatening infection or medical condition causing the pain.  I did recommend a second opinion with gastroenterology.  The patient is due for a colonoscopy anyway.  My suspicion is that this pain may likely be adhesions from her previous appendectomy.  I doubt IBS is the cause of this pain as it seems to be more musculoskeletal.  Less likely would be thoracic radiculopathy.  Patient agrees with seeing a gastroenterologist for a second opinion.  In the meantime we will use a muscle relaxer to see if this helps the pain at all.

## 2023-04-10 ENCOUNTER — Other Ambulatory Visit: Payer: Self-pay | Admitting: Family Medicine

## 2023-04-15 ENCOUNTER — Telehealth: Payer: Self-pay

## 2023-04-15 NOTE — Telephone Encounter (Signed)
Ale[facility] called stating that MRI was denied and a P2P/appeal can be done by calling [phone] Case # [id number]. Patient is scheduled for 04/21/2023

## 2023-04-19 NOTE — Telephone Encounter (Signed)
A new order was started. A P2P needs to be done by calling [phone] and case/order number is [id number].

## 2023-04-21 ENCOUNTER — Other Ambulatory Visit: Payer: Medicaid Other

## 2023-04-27 ENCOUNTER — Ambulatory Visit: Payer: BC Managed Care – PPO | Admitting: Allergy and Immunology

## 2023-05-17 ENCOUNTER — Encounter: Payer: Self-pay | Admitting: Family Medicine

## 2023-06-29 ENCOUNTER — Ambulatory Visit: Payer: Medicaid Other | Admitting: Allergy and Immunology

## 2023-08-31 ENCOUNTER — Telehealth: Payer: Self-pay

## 2023-08-31 ENCOUNTER — Encounter: Payer: Self-pay | Admitting: Allergy and Immunology

## 2023-08-31 ENCOUNTER — Other Ambulatory Visit (HOSPITAL_COMMUNITY): Payer: Self-pay

## 2023-08-31 ENCOUNTER — Other Ambulatory Visit: Payer: Self-pay

## 2023-08-31 ENCOUNTER — Ambulatory Visit: Admitting: Allergy and Immunology

## 2023-08-31 VITALS — BP 118/88 | HR 75 | Temp 97.7°F | Resp 18 | Ht 63.25 in | Wt 199.1 lb

## 2023-08-31 DIAGNOSIS — K219 Gastro-esophageal reflux disease without esophagitis: Secondary | ICD-10-CM | POA: Diagnosis not present

## 2023-08-31 DIAGNOSIS — T148XXD Other injury of unspecified body region, subsequent encounter: Secondary | ICD-10-CM | POA: Diagnosis not present

## 2023-08-31 DIAGNOSIS — T148XXA Other injury of unspecified body region, initial encounter: Secondary | ICD-10-CM

## 2023-08-31 DIAGNOSIS — J4520 Mild intermittent asthma, uncomplicated: Secondary | ICD-10-CM

## 2023-08-31 DIAGNOSIS — J3089 Other allergic rhinitis: Secondary | ICD-10-CM

## 2023-08-31 DIAGNOSIS — G43909 Migraine, unspecified, not intractable, without status migrainosus: Secondary | ICD-10-CM

## 2023-08-31 MED ORDER — MOMETASONE FUROATE 50 MCG/ACT NA SUSP: NASAL | 5 refills | Status: AC

## 2023-08-31 MED ORDER — FAMOTIDINE 40 MG PO TABS: ORAL_TABLET | ORAL | 3 refills | Status: AC

## 2023-08-31 MED ORDER — PANTOPRAZOLE SODIUM 40 MG PO TBEC: 40.0000 mg | DELAYED_RELEASE_TABLET | Freq: Two times a day (BID) | ORAL | 3 refills | Status: AC

## 2023-08-31 MED ORDER — VENTOLIN HFA 108 (90 BASE) MCG/ACT IN AERS: INHALATION_SPRAY | RESPIRATORY_TRACT | 1 refills | Status: DC

## 2023-08-31 NOTE — Telephone Encounter (Signed)
 Per provider:  Please refer to Neurology for headaches.   Forwarding message to [place] Admin to process referral.

## 2023-08-31 NOTE — Progress Notes (Unsigned)
 [name] - [place] - [name] - [place] - [name]   Follow-up Note  Referring Provider: Austine Lefort, MD Primary Provider: Austine Lefort, MD Date of Office Visit: 08/31/2023  Subjective:   Catherine Rodriguez (DOB: 03/11/1971) is a 53 y.o. female who [facility] in re-evaluation of the following:  HPI: Maddison returns to this clinic in evaluation of asthma, allergic rhinitis, LPR.  I last saw her in his clinic 28 April 2022.  She continues to do very well with her airway and she has a rare requirement for short acting bronchodilator and she can exercise without any difficulty and she has not required a systemic steroid or an antibiotic while utilizing some nasal steroid as her only controller agent.  Her reflux is still somewhat active.  She has some burning in the back of her throat on occasion even while using pantoprazole  twice a day.  She has either 1 coffee or 1 tea per day but no other sources of caffeine and she does not drink any alcohol.  She has been having chronic daily headaches for years.  They are particularly bad about 1 time per week and basically leave her nonfunctional.  She states that she has had some bruising on her lower extremities over the course of the past few months.  Allergies as of 08/31/2023       Reactions   Doxycycline  Nausea And Vomiting   Macrobid  [nitrofurantoin  Macrocrystal] Nausea And Vomiting   Penicillins    Got real sick        Medication List    albuterol  108 (90 Base) MCG/ACT inhaler Commonly known as: VENTOLIN  HFA Inhale 2 puffs into the lungs every 4 (four) hours as needed for wheezing or shortness of breath.   ALPRAZolam  1 MG tablet Commonly known as: XANAX  as directed.   budesonide  32 MCG/ACT nasal spray Commonly known as: RHINOCORT  AQUA Place 1 spray into both nostrils daily.   chlorpheniramine-HYDROcodone  10-8 MG/5ML Suer Commonly known as: Tussionex  Pennkinetic ER Take 5 mLs by mouth every 12 (twelve) hours as needed for cough.   cyclobenzaprine  10 MG tablet Commonly known as: FLEXERIL  TAKE ONE TABLET BY MOUTH THREE TIMES DAILY AS NEEDED FOR MUSCLE SPASMS   D3-50 1.25 MG (50000 UT) capsule Generic drug: Cholecalciferol Take 1 tablet by mouth once a week.   diclofenac  1.3 % Ptch Commonly known as: Flector  Place 1 patch onto the skin 2 (two) times daily.   eszopiclone 1 MG Tabs tablet Commonly known as: LUNESTA Take 1 mg by mouth at bedtime as needed for sleep. Take immediately before bedtime   Eszopiclone 3 MG Tabs Take 3 mg by mouth at bedtime.   famotidine  40 MG tablet Commonly known as: PEPCID  Take 1 tablet in the evening   ibuprofen  800 MG tablet Commonly known as: ADVIL  TAKE 1 TABLET BY MOUTH EVERY 8 HOURS AS NEEDED   meloxicam 15 MG tablet Commonly known as: MOBIC Take 15 mg by mouth every other day.   mometasone  50 MCG/ACT nasal spray Commonly known as: Nasonex  1 spray each nostril 2 times per day   pantoprazole  40 MG tablet Commonly known as: PROTONIX  Take 1 tablet (40 mg total) by mouth 2 (two) times daily.   promethazine  25 MG tablet Commonly known as: PHENERGAN  TAKE 1 TABLET BY MOUTH EVERY 8 HOURS AS NEEDED FOR NAUSEA FOR VOMITING   sertraline 100 MG tablet Commonly known as: ZOLOFT Take 100 mg by mouth  daily.   traMADol  50 MG tablet Commonly known as: ULTRAM  Take 50 mg by mouth 2 (two) times daily.   Trelegy Ellipta  200-62.5-25 MCG/INH Aepb Generic drug: Fluticasone-Umeclidin-Vilant Inhale 1 puff into the lungs daily.   valACYclovir  500 MG tablet Commonly known as: VALTREX  TAKE ONE TABLET BY MOUTH TWICE DAILY AT ONSET OF OUTBREAK FOR FIVE DAYS   Wegovy  0.25 MG/0.5ML Soaj Generic drug: Semaglutide -Weight Management Inject as directed once weekly.        Past Medical History:  Diagnosis Date   ASCUS with positive high risk HPV cervical 2013   normal colposcopy   Asthma    GERD  (gastroesophageal reflux disease)    Herpes simplex    IBS (irritable bowel syndrome)     Past Surgical History:  Procedure Laterality Date   APPENDECTOMY  1993   eyellid  2010   THERAPEUTIC ABORTION     X 2   tubes in ears     as a child    Review of systems negative except as noted in HPI / PMHx or noted below:  Review of Systems  Constitutional: Negative.   HENT: Negative.    Eyes: Negative.   Respiratory: Negative.    Cardiovascular: Negative.   Gastrointestinal: Negative.   Genitourinary: Negative.   Musculoskeletal: Negative.   Skin: Negative.   Neurological: Negative.   Endo/Heme/Allergies: Negative.   Psychiatric/Behavioral: Negative.       Objective:   Vitals:   08/31/23 1124  BP: 118/88  Pulse: 75  Resp: 18  Temp: 97.7 F (36.5 C)  SpO2: 99%   Height: 5' 3.25 (160.7 cm)  Weight: 199 lb 1.6 oz (90.3 kg)   Physical Exam Constitutional:      Appearance: She is not diaphoretic.  HENT:     Head: Normocephalic.     Right Ear: Tympanic membrane, ear canal and external ear normal.     Left Ear: Tympanic membrane, ear canal and external ear normal.     Nose: Nose normal. No mucosal edema or rhinorrhea.     Mouth/Throat:     Pharynx: Uvula midline. No oropharyngeal exudate.  Eyes:     Conjunctiva/sclera: Conjunctivae normal.  Neck:     Thyroid : No thyromegaly.     Trachea: Trachea normal. No tracheal tenderness or tracheal deviation.  Cardiovascular:     Rate and Rhythm: Normal rate and regular rhythm.     Heart sounds: Normal heart sounds, S1 normal and S2 normal. No murmur heard. Pulmonary:     Effort: No respiratory distress.     Breath sounds: Normal breath sounds. No stridor. No wheezing or rales.  Lymphadenopathy:     Head:     Right side of head: No tonsillar adenopathy.     Left side of head: No tonsillar adenopathy.     Cervical: No cervical adenopathy.  Skin:    Findings: Rash (scattered superficial bruising lower extremities)  present. No erythema.     Nails: There is no clubbing.  Neurological:     Mental Status: She is alert.     Diagnostics: Spirometry was performed and demonstrated an FEV1 of 2.64 at 104 % of predicted.   Assessment and Plan:   1. Asthma, mild intermittent, well-controlled   2. Perennial allergic rhinitis   3. LPRD (laryngopharyngeal reflux disease)   4. Bruising   5. Migraine syndrome    1.  Continue to treat and prevent reflux / LPR:   A.  Pantoprazole  40 mg -2 tablets daily  B.  Start famotidine  40 mg - 1 tablet in evening  C.  May need endoscopy  D.  Contact clinic in 2 weeks with update    2.  If needed:   A. Albuterol  HFA - 2 inhalations every 4-6 hours  B. OTC antihistamine  C. Nasonex  - 1 spray each nostril 1- 7 times per week  4. Return to clinic in 12 months or earlier if needed  5. Refer to neurology for headaches   6. Evaluate for bruising: CBC w/d, CMP, PT, PTT  Shakeila appears to be doing quite well with her airway and she can continue on some as needed albuterol  and antihistamines and nasal steroid.  She still having some issues with classic reflux while using pantoprazole  twice a day and she can start famotidine  in the evening and if this does not work then she needs to go back to see her gastroenterologist for an endoscopy.  I have asked her to contact us  in this clinic with an update at 2 weeks.  We will refer her onto neurology for her headaches.  We will evaluate her for bruising with the blood test noted above.  Schuyler Custard, MD Allergy / Immunology [facility]

## 2023-08-31 NOTE — Telephone Encounter (Signed)
 Called patient - DOB/DPR verified - advised provider wanted her to have the following blood work done due to the bruising before leaving:  CBC w/diff/plat CMET w/ GFR PT and PTT  Patient advised labs have been ordered  when she comes to the office advise front staff she's here for blood work only.   Patient verbalized understanding to all - stated she will try to come by the office by the end of week.

## 2023-08-31 NOTE — Telephone Encounter (Signed)
 Pharmacy Patient Advocate Encounter   Received notification from CoverMyMeds that prior authorization for Famotidine  40MG  tablets  is required/requested.   Insurance verification completed.   The patient is insured through [facility] .   Per test claim: Refill too soon. PA is not needed at this time. Medication was filled 08/31/2023. Next eligible fill date is 11/07/2023.

## 2023-08-31 NOTE — Patient Instructions (Addendum)
  1.  Continue to treat and prevent reflux / LPR:   A.  Pantoprazole  40 mg -2 tablets daily  B.  Start famotidine  40 mg - 1 tablet in evening  C.  May need endoscopy  D.  Contact clinic in 2 weeks with update   2.  If needed:   A. Albuterol  HFA - 2 inhalations every 4-6 hours  B. OTC antihistamine  C. Nasonex  - 1 spray each nostril 1- 7 times per week  4. Return to clinic in 12 months or earlier if needed  5. Refer to neurology for headaches   6. Evaluate for bruising: CBC w/d, CMP, PT, PTT

## 2023-09-01 ENCOUNTER — Telehealth: Payer: Self-pay

## 2023-09-01 ENCOUNTER — Encounter: Payer: Self-pay | Admitting: Allergy and Immunology

## 2023-09-01 ENCOUNTER — Other Ambulatory Visit (HOSPITAL_COMMUNITY): Payer: Self-pay

## 2023-09-01 NOTE — Telephone Encounter (Signed)
*  Asthma/Allergy  Pharmacy Patient Advocate Encounter   Received notification from CoverMyMeds that prior authorization for Mometasone  Furoate 50MCG/ACT suspension  is required/requested.   Insurance verification completed.   The patient is insured through [facility] .   Per test claim: PA required; PA started via CoverMyMeds. KEY [id number] . Please see clinical question(s) below that I am not finding the answer to in their chart and advise.

## 2023-09-03 NOTE — Telephone Encounter (Signed)
 Approved today by [facility]  Medicaid PA Case: [id number], Status: Approved, Coverage Starts on: 09/03/2023 12:00:00 AM, Coverage Ends on: 09/02/2024 12:00:00 AM. Effective Date: 09/03/2023 Authorization Expiration Date: 09/02/2024

## 2023-09-03 NOTE — Telephone Encounter (Signed)
 I called the patient to notify her of the approval. She also confirmed that the pharmacy is the correct one. She plans to call before going to pick up the nasal spray from [facility].

## 2023-09-03 NOTE — Telephone Encounter (Signed)
 Submitted with additional information

## 2023-09-04 LAB — COMPREHENSIVE METABOLIC PANEL WITH GFR
ALT: 17 IU/L (ref 0–32)
AST: 20 IU/L (ref 0–40)
Albumin: 4.6 g/dL (ref 3.8–4.9)
Alkaline Phosphatase: 87 IU/L (ref 44–121)
BUN/Creatinine Ratio: 17 (ref 9–23)
BUN: 16 mg/dL (ref 6–24)
Bilirubin Total: 0.3 mg/dL (ref 0.0–1.2)
CO2: 22 mmol/L (ref 20–29)
Calcium: 10.3 mg/dL — ABNORMAL HIGH (ref 8.7–10.2)
Chloride: 101 mmol/L (ref 96–106)
Creatinine, Ser: 0.93 mg/dL (ref 0.57–1.00)
Globulin, Total: 2.7 g/dL (ref 1.5–4.5)
Glucose: 95 mg/dL (ref 70–99)
Potassium: 4.2 mmol/L (ref 3.5–5.2)
Sodium: 141 mmol/L (ref 134–144)
Total Protein: 7.3 g/dL (ref 6.0–8.5)
eGFR: 73 mL/min/{1.73_m2} (ref >59)

## 2023-09-04 LAB — CBC WITH DIFFERENTIAL/PLATELET
Basophils Absolute: 0 10*3/uL (ref 0.0–0.2)
Basos: 1 %
EOS (ABSOLUTE): 0.1 10*3/uL (ref 0.0–0.4)
Eos: 1 %
Hematocrit: 48 % — ABNORMAL HIGH (ref 34.0–46.6)
Hemoglobin: 15.8 g/dL (ref 11.1–15.9)
Immature Grans (Abs): 0 10*3/uL (ref 0.0–0.1)
Immature Granulocytes: 0 %
Lymphocytes Absolute: 2.1 10*3/uL (ref 0.7–3.1)
Lymphs: 26 %
MCH: 30.6 pg (ref 26.6–33.0)
MCHC: 32.9 g/dL (ref 31.5–35.7)
MCV: 93 fL (ref 79–97)
Monocytes Absolute: 0.4 10*3/uL (ref 0.1–0.9)
Monocytes: 5 %
Neutrophils Absolute: 5.4 10*3/uL (ref 1.4–7.0)
Neutrophils: 67 %
Platelets: 303 10*3/uL (ref 150–450)
RBC: 5.16 x10E6/uL (ref 3.77–5.28)
RDW: 12.2 % (ref 11.7–15.4)
WBC: 8 10*3/uL (ref 3.4–10.8)

## 2023-09-04 LAB — PT AND PTT
INR: 1 (ref 0.9–1.2)
Prothrombin Time: 10.9 s (ref 9.1–12.0)
aPTT: 26 s (ref 24–33)

## 2023-09-06 ENCOUNTER — Ambulatory Visit: Payer: Self-pay | Admitting: Allergy and Immunology

## 2023-09-07 NOTE — Telephone Encounter (Signed)
 Per [place] Admin:  Catherine Rodriguez has been internally referred to [facility].  They will reach out to the patient to schedule.  I will follow up in one week.

## 2023-09-08 NOTE — Telephone Encounter (Signed)
 Passion has been scheduled with [facility].  She is scheduled for 01/24/24 at 1:00 pm with Dr. Tresia Fruit.

## 2023-09-09 NOTE — Telephone Encounter (Signed)
 I called [facility] here in [place].  They could get Brighid in as soon as July.  I faxed the referral to Devra Fontana at [place].  Devra Fontana said she will call Jaquayla as soon as she receives the referral to get her scheduled.  I called and spoke with Trevor Fudge and she was very grateful and cancelled her appointment with [facility].

## 2024-01-02 ENCOUNTER — Other Ambulatory Visit: Payer: Self-pay | Admitting: Allergy and Immunology

## 2024-01-04 LAB — HM MAMMOGRAPHY

## 2024-01-05 ENCOUNTER — Encounter: Payer: Self-pay | Admitting: Family Medicine

## 2024-01-14 ENCOUNTER — Ambulatory Visit: Admitting: Nurse Practitioner

## 2024-01-14 ENCOUNTER — Encounter: Payer: Self-pay | Admitting: Nurse Practitioner

## 2024-01-14 VITALS — BP 118/82 | HR 86 | Temp 98.1°F | Ht 66.0 in | Wt 212.0 lb

## 2024-01-14 DIAGNOSIS — R351 Nocturia: Secondary | ICD-10-CM | POA: Diagnosis not present

## 2024-01-14 DIAGNOSIS — R350 Frequency of micturition: Secondary | ICD-10-CM

## 2024-01-14 DIAGNOSIS — N3281 Overactive bladder: Secondary | ICD-10-CM

## 2024-01-14 MED ORDER — GEMTESA 75 MG PO TABS: 1.0000 | ORAL_TABLET | Freq: Every day | ORAL | 2 refills | Status: DC

## 2024-01-14 NOTE — Progress Notes (Signed)
   Acute Office Visit  Subjective:    Patient ID: Catherine Rodriguez, female    DOB: [birth year], 53 y.o.   MRN: [id number]   HPI 53 y.o. presents today for frequent urination for a couple of months. Occurs all the time but is worse at night. Urinating up to 5 times over night. Experiences urge incontinence. Denies burning with urination, hematuria, back pain. Only new medication is Topamax.   Patient's last menstrual period was 12/20/2017.    Review of Systems  Constitutional: Negative.   Genitourinary:  Positive for frequency and urgency. Negative for difficulty urinating, dysuria, hematuria and pelvic pain.       Objective:    Physical Exam Constitutional:      Appearance: Normal appearance.     BP 118/82 (BP Location: Left Arm, Patient Position: Sitting, Cuff Size: Normal)   Pulse 86   Temp 98.1 F (36.7 C) (Oral)   Ht 5' 6 (1.676 m)   Wt 212 lb (96.2 kg)   LMP 12/20/2017   SpO2 (!) 86%   BMI 34.22 kg/m  Wt Readings from Last 3 Encounters:  01/14/24 212 lb (96.2 kg)  08/31/23 199 lb 1.6 oz (90.3 kg)  04/05/23 207 lb (93.9 kg)        UA negative   Assessment & Plan:   Problem List Items Addressed This Visit       Other   Urinary frequency   Relevant Orders   Urinalysis,Complete w/RFL Culture   Other Visit Diagnoses       OAB (overactive bladder)    -  Primary   Relevant Medications   Vibegron (GEMTESA) 75 MG TABS     Nocturia          Plan: Discussed overactive bladder S/S and management options. Aware a less common side effect to Topamax is frequent urination. Wants to try a medication. Gemtesa sent to specialty pharmacy. Samples provided.   Return if symptoms worsen or fail to improve.    [name] DNP, 1:32 PM 01/14/2024

## 2024-01-16 LAB — URINALYSIS, COMPLETE W/RFL CULTURE
Bilirubin Urine: NEGATIVE
Casts: NONE SEEN /LPF
Crystals: NONE SEEN /HPF
Glucose, UA: NEGATIVE
Hgb urine dipstick: NEGATIVE
Leukocyte Esterase: NEGATIVE
Nitrites, Initial: NEGATIVE
Protein, ur: NEGATIVE
RBC / HPF: NONE SEEN /HPF (ref 0–2)
Specific Gravity, Urine: 1.02 (ref 1.001–1.035)
WBC, UA: NONE SEEN /HPF (ref 0–5)
Yeast: NONE SEEN /HPF
pH: 6 (ref 5.0–8.0)

## 2024-01-16 LAB — URINE CULTURE
MICRO NUMBER:: 17144405
Result:: NO GROWTH
SPECIMEN QUALITY:: ADEQUATE

## 2024-01-16 LAB — CULTURE INDICATED

## 2024-01-17 ENCOUNTER — Ambulatory Visit: Payer: Self-pay | Admitting: Nurse Practitioner

## 2024-01-17 ENCOUNTER — Telehealth: Payer: Self-pay

## 2024-01-17 NOTE — Telephone Encounter (Signed)
 Prior authorization needed for Gemtesa. Patient is aware it may take several days for a response back.  Key: [id number]

## 2024-01-19 ENCOUNTER — Other Ambulatory Visit: Payer: Self-pay | Admitting: Nurse Practitioner

## 2024-01-19 DIAGNOSIS — N3281 Overactive bladder: Secondary | ICD-10-CM

## 2024-01-19 MED ORDER — MIRABEGRON ER 25 MG PO TB24: 25.0000 mg | ORAL_TABLET | Freq: Every day | ORAL | 1 refills | Status: DC

## 2024-01-19 NOTE — Telephone Encounter (Signed)
 Attempted to contact patient to let her know that PA was denied. Mailbox was full. Routing to [employer]. PA stated that patient has to try and fail two other covered alternatives. Alternatives are fesoterodine extended-release tablet, myrbetriq extended-release tablet, oxybutynin solution/syrup/tablet/extended-release tablet, solifenacin tablet, tolterodine tablet/extended-release capsule.

## 2024-01-19 NOTE — Telephone Encounter (Signed)
 Myrbetriq 25 mg sent to [facility]. We can increase to 50 mg after 4-8 weeks if needed.

## 2024-01-19 NOTE — Telephone Encounter (Signed)
 Patient aware rx was sent to her pharmacy. She says [facility] now but she will either have it transferred or she will pick it up from there.

## 2024-01-24 ENCOUNTER — Ambulatory Visit: Admitting: Neurology

## 2024-02-04 ENCOUNTER — Other Ambulatory Visit: Payer: Self-pay | Admitting: Nurse Practitioner

## 2024-02-04 DIAGNOSIS — B009 Herpesviral infection, unspecified: Secondary | ICD-10-CM

## 2024-02-04 NOTE — Telephone Encounter (Signed)
 Med refill request:   valACYclovir  (VALTREX ) 500 MG tablet  Start:  04/05/23 Disp: 30 tablets Refills:  2  Last AEX:  02/08/23 Next AEX:  03/08/24 Last MMG (if hormonal med):  N/A Refill authorized? Please Advise.

## 2024-03-08 ENCOUNTER — Encounter: Payer: Self-pay | Admitting: Nurse Practitioner

## 2024-03-08 ENCOUNTER — Ambulatory Visit: Admitting: Nurse Practitioner

## 2024-03-08 VITALS — BP 116/70 | HR 70 | Resp 16 | Ht 64.0 in | Wt 216.0 lb

## 2024-03-08 DIAGNOSIS — Z01419 Encounter for gynecological examination (general) (routine) without abnormal findings: Secondary | ICD-10-CM

## 2024-03-08 DIAGNOSIS — M2550 Pain in unspecified joint: Secondary | ICD-10-CM | POA: Diagnosis not present

## 2024-03-08 DIAGNOSIS — Z1211 Encounter for screening for malignant neoplasm of colon: Secondary | ICD-10-CM

## 2024-03-08 DIAGNOSIS — Z1331 Encounter for screening for depression: Secondary | ICD-10-CM | POA: Diagnosis not present

## 2024-03-08 DIAGNOSIS — Z23 Encounter for immunization: Secondary | ICD-10-CM

## 2024-03-08 DIAGNOSIS — B009 Herpesviral infection, unspecified: Secondary | ICD-10-CM

## 2024-03-08 DIAGNOSIS — N3281 Overactive bladder: Secondary | ICD-10-CM | POA: Diagnosis not present

## 2024-03-08 MED ORDER — MIRABEGRON ER 25 MG PO TB24: 25.0000 mg | ORAL_TABLET | Freq: Every day | ORAL | 1 refills | Status: AC

## 2024-03-08 MED ORDER — VALACYCLOVIR HCL 500 MG PO TABS: 500.0000 mg | ORAL_TABLET | Freq: Two times a day (BID) | ORAL | 2 refills | Status: AC

## 2024-03-08 MED ORDER — IBUPROFEN 800 MG PO TABS: 800.0000 mg | ORAL_TABLET | Freq: Three times a day (TID) | ORAL | 0 refills | Status: AC | PRN

## 2024-03-08 NOTE — Progress Notes (Signed)
 Catherine Rodriguez 31-Mar-1970 [id number]   History:  53 y.o. G2P0020 presents for annual exam. Postmenopausal - no HRT, no bleeding. 2013 ASCUS positive HPV, subsequent paps normal. HSV, rare outbreaks. History of RA, IBS and asthma. Started Myrbetriq  for OAB about 6 weeks ago. Gemtesa  worked great but not covered by community education officer. Has not noticed improvement in symptoms with Myrbetriq . Wants flu vaccine today. MRI showed benign cyst on brain 3 cm, monitoring.   Gynecologic History No LMP recorded. Postmenopausal Contraception/Family planning: postmenopausal status Sexually active: No  Health Maintenance Last Pap: 02/08/2023. Results were: Normal neg HPV Last mammogram: 01/04/2024. Results were: Normal Last colonoscopy: 2013. Results were: tubular adenoma, 5-year repeat Last Dexa: Not indicated     03/08/2024    2:12 PM  Depression screen PHQ 2/9  Decreased Interest 0  Down, Depressed, Hopeless 0  PHQ - 2 Score 0     Past medical history, past surgical history, family history and social history were all reviewed and documented in the EPIC chart. Single. Retired. 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 Sister diagnosed with breast cancer at age 36.  ROS:  A ROS was performed and pertinent positives and negatives are included.  Exam:  Vitals:   03/08/24 1407  BP: 116/70  Pulse: 70  Resp: 16  Weight: 216 lb (98 kg)  Height: 5' 4 (1.626 m)     Body mass index is 37.08 kg/m.  General appearance:  Normal Thyroid :  Symmetrical, normal in size, without palpable masses or nodularity. Respiratory  Auscultation:  Clear without wheezing or rhonchi Cardiovascular  Auscultation:  Regular rate, without rubs, murmurs or gallops  Edema/varicosities:  Not grossly evident Abdominal  Soft,nontender, without masses, guarding or rebound.  Liver/spleen:  No organomegaly noted  Hernia:  None appreciated  Skin  Inspection:  Grossly normal   Breasts:  Examined lying and sitting.   Right: Without masses, retractions, discharge or axillary adenopathy.   Left: Without masses, retractions, discharge or axillary adenopathy. Pelvic: External genitalia:  no lesions              Urethra:  normal appearing urethra with no masses, tenderness or lesions              Bartholins and Skenes: normal                 Vagina: normal appearing vagina with normal color and discharge, no lesions              Cervix: no lesions Bimanual Exam:  Uterus:  no masses or tenderness              Adnexa: no mass, fullness, tenderness              Rectovaginal: Deferred              Anus:  normal, no lesions  Catherine Rodriguez, CMA present as chaperone.   Assessment/Plan:  53 y.o. G2P0020 for annual exam.   Well female exam with routine gynecological exam - Education provided on SBEs, importance of preventative screenings, current guidelines, high calcium diet, regular exercise, and multivitamin daily. Labs with PCP.  Postmenopausal - no HRT, no bleeding  Depression screening - PHQ - 0  Cervical cancer screening -  2013 ASCUS with positive HPV, prior and subsequent paps normal.  Will repeat at 5-year interval per guidelines.   HSV (herpes simplex virus) infection - Plan: valACYclovir  (VALTREX ) 500 MG tablet BID x 3-5 days at first  sign of outbreak. Rare outbreaks.  OAB (overactive bladder) - Plan: mirabegron  ER (MYRBETRIQ ) 25 MG TB24 tablet daily. No improvement in symptoms. Can increase after 8 weeks. Gemtesa  worked great but not covered by community education officer.   Arthralgia, unspecified joint - Plan: ibuprofen  (ADVIL ) 800 MG tablet as needed for arthritic pain.   Screening for colon cancer - Plan: Ambulatory referral to Gastroenterology. 2013 colonoscopy.   Screening for breast cancer - Normal mammogram history.  Sister diagnosed with breast cancer at age 37.  Continue annual screenings.  Normal breast exam today.  Screening for osteoporosis - Average risk. Will plan for  DXA at age 23.   Return in about 1 year (around 03/08/2025) for Annual.     Catherine Rodriguez Shutter [facility], 2:32 PM 03/08/2024

## 2024-03-31 ENCOUNTER — Other Ambulatory Visit: Payer: Self-pay | Admitting: Medical Genetics

## 2024-04-13 ENCOUNTER — Other Ambulatory Visit: Payer: Self-pay | Admitting: Allergy and Immunology

## 2024-04-26 ENCOUNTER — Encounter: Payer: Self-pay | Admitting: Gastroenterology

## 2024-04-26 ENCOUNTER — Encounter: Payer: Self-pay | Admitting: Pediatrics

## 2024-05-02 ENCOUNTER — Ambulatory Visit: Admitting: Allergy and Immunology

## 2024-05-18 ENCOUNTER — Encounter

## 2024-06-01 ENCOUNTER — Encounter: Admitting: Pediatrics

## 2024-06-27 ENCOUNTER — Other Ambulatory Visit

## 2025-03-09 ENCOUNTER — Ambulatory Visit: Admitting: Nurse Practitioner
